# Patient Record
Sex: Female | Born: 1970 | Race: White | Hispanic: No | Marital: Married
Health system: Midwestern US, Community
[De-identification: ages and names within clinical notes are randomized; demographics above are authoritative.]

## PROBLEM LIST (undated history)

## (undated) DIAGNOSIS — Z01818 Encounter for other preprocedural examination: Secondary | ICD-10-CM

## (undated) DIAGNOSIS — N6320 Unspecified lump in the left breast, unspecified quadrant: Secondary | ICD-10-CM

## (undated) DIAGNOSIS — Z1231 Encounter for screening mammogram for malignant neoplasm of breast: Secondary | ICD-10-CM

## (undated) DIAGNOSIS — N800 Endometriosis of uterus: Secondary | ICD-10-CM

## (undated) DIAGNOSIS — R928 Other abnormal and inconclusive findings on diagnostic imaging of breast: Secondary | ICD-10-CM

## (undated) DIAGNOSIS — Z1239 Encounter for other screening for malignant neoplasm of breast: Secondary | ICD-10-CM

## (undated) DIAGNOSIS — G8918 Other acute postprocedural pain: Secondary | ICD-10-CM

## (undated) DIAGNOSIS — Z8619 Personal history of other infectious and parasitic diseases: Secondary | ICD-10-CM

## (undated) DIAGNOSIS — T7840XA Allergy, unspecified, initial encounter: Secondary | ICD-10-CM

## (undated) DIAGNOSIS — L309 Dermatitis, unspecified: Secondary | ICD-10-CM

## (undated) DIAGNOSIS — N951 Menopausal and female climacteric states: Secondary | ICD-10-CM

## (undated) HISTORY — DX: Personal history of other infectious and parasitic diseases: Z86.19

## (undated) HISTORY — DX: Allergy, unspecified, initial encounter: T78.40XA

## (undated) HISTORY — DX: Dermatitis, unspecified: L30.9

---

## 1992-03-05 HISTORY — PX: WISDOM TOOTH EXTRACTION: SHX21

## 2000-03-05 HISTORY — PX: DILATION AND CURETTAGE OF UTERUS: SHX78

## 2005-12-05 ENCOUNTER — Ambulatory Visit: Payer: Self-pay | Admitting: Internal Medicine

## 2006-03-05 HISTORY — PX: CYSTOSCOPY: SUR368

## 2007-04-11 ENCOUNTER — Ambulatory Visit: Payer: Self-pay | Admitting: Urology

## 2007-04-14 ENCOUNTER — Ambulatory Visit: Payer: Self-pay | Admitting: Urology

## 2011-05-24 ENCOUNTER — Ambulatory Visit: Payer: Self-pay | Admitting: Family Medicine

## 2011-05-24 ENCOUNTER — Emergency Department: Payer: Self-pay | Admitting: *Deleted

## 2011-05-24 LAB — COMPREHENSIVE METABOLIC PANEL
Albumin: 4.2 g/dL (ref 3.4–5.0)
Anion Gap: 6 — ABNORMAL LOW (ref 7–16)
Bilirubin,Total: 0.3 mg/dL (ref 0.2–1.0)
Co2: 31 mmol/L (ref 21–32)
Creatinine: 0.85 mg/dL (ref 0.60–1.30)
EGFR (African American): >60
Glucose: 116 mg/dL — ABNORMAL HIGH (ref 65–99)
Potassium: 4.4 mmol/L (ref 3.5–5.1)
SGOT(AST): 12 U/L — ABNORMAL LOW (ref 15–37)
Sodium: 139 mmol/L (ref 136–145)
Total Protein: 7.2 g/dL (ref 6.4–8.2)

## 2011-05-24 LAB — CBC
HCT: 39.7 % (ref 35.0–47.0)
HGB: 13.3 g/dL (ref 12.0–16.0)
MCV: 88 fL (ref 80–100)
Platelet: 247 10*3/uL (ref 150–440)

## 2011-05-24 LAB — LIPASE, BLOOD: Lipase: 125 U/L (ref 73–393)

## 2011-05-28 ENCOUNTER — Ambulatory Visit: Payer: Self-pay | Admitting: Family Medicine

## 2012-02-09 ENCOUNTER — Ambulatory Visit (INDEPENDENT_AMBULATORY_CARE_PROVIDER_SITE_OTHER): Payer: 59 | Admitting: Physician Assistant

## 2012-02-09 VITALS — BP 113/72 | HR 70 | Temp 98.1°F | Resp 16 | Ht 62.0 in | Wt 135.0 lb

## 2012-02-09 DIAGNOSIS — H9190 Unspecified hearing loss, unspecified ear: Secondary | ICD-10-CM

## 2012-02-09 DIAGNOSIS — H6120 Impacted cerumen, unspecified ear: Secondary | ICD-10-CM

## 2012-02-09 NOTE — Progress Notes (Signed)
   Patient ID: Renee Brooks MRN: [id number], DOB: August 16, 1970, 41 y.o. Date of Encounter: 02/09/2012, 7:11 PM  Primary Physician: Clydell Hakim, MD  Chief Complaint: Cerumen impaction  HPI: 41 y.o. year old female with history below presents with bilateral cerumen impaction. Symptoms began several weeks ago. Left side is worse than the right side. History of increased wax production requiring the ears to be flushed out previously. She complains of considerably decreased hearing along the left side. Last time she had them cleaned out was at ENT about 2 years ago. Uses Q-tips and feels like she may have pushed the wax further down her ear canal. No tinnitus, or vertigo symptoms. No drainage, discharge, or bleeding from the ears. No URI symptoms. Otherwise doing well without issues today.    Past Medical History  Diagnosis Date  . Allergy      Home Meds: Prior to Admission medications   Medication Sig Start Date End Date Taking? Authorizing Provider  ibuprofen (ADVIL,MOTRIN) 800 MG tablet Take 800 mg by mouth every 8 (eight) hours as needed.   Yes Historical Provider, MD    Allergies: No Known Allergies  History   Social History  . Marital Status: Married    Spouse Name: N/A    Number of Children: N/A  . Years of Education: N/A   Occupational History  . Not on file.   Social History Main Topics  . Smoking status: Never Smoker   . Smokeless tobacco: Not on file  . Alcohol Use: No  . Drug Use: No  . Sexually Active: Yes    Birth Control/ Protection: Other-see comments   Other Topics Concern  . Not on file   Social History Narrative  . No narrative on file     Review of Systems: Constitutional: negative for chills, fever, night sweats, weight changes, or fatigue  HEENT: negative for vision changes, congestion, rhinorrhea, ST, epistaxis, or sinus pressure Cardiovascular: negative for chest pain or palpitations Respiratory: negative for cough   Physical  Exam: Blood pressure 113/72, pulse 70, temperature 98.1 F (36.7 C), temperature source Oral, resp. rate 16, height 5\' 2"  (1.575 m), weight 135 lb (61.236 kg), last menstrual period 01/26/2012, SpO2 99.00%., Body mass index is 24.69 kg/(m^2). General: Well developed, well nourished, in no acute distress. Head: Normocephalic, atraumatic, eyes without discharge, sclera non-icteric, nares are without discharge. Bilateral auditory canals with cerumen impaction. S/P ear lavage bilateral auditory canals clear. Bilateral TM's are without perforation, pearly grey and translucent with reflective cone of light bilaterally. No mastoid TTP. Oral cavity moist, posterior pharynx without exudate, erythema, peritonsillar abscess, or post nasal drip. Neck: Supple. No thyromegaly. Full ROM. No lymphadenopathy. Lungs: Clear bilaterally to auscultation without wheezes, rales, or rhonchi. Breathing is unlabored. Heart: RRR with S1 S2. No murmurs, rubs, or gallops appreciated. Msk:  Strength and tone normal for age. Extremities/Skin: Warm and dry. No clubbing or cyanosis. No edema. No rashes or suspicious lesions. Neuro: Alert and oriented X 3. Moves all extremities spontaneously. Gait is normal. CNII-XII grossly in tact. Psych:  Responds to questions appropriately with a normal affect.   Ear lavage performed by: Karlene Einstein   ASSESSMENT AND PLAN:  41 y.o. year old female with cerumen impaction s/p ear lavage. -Resolved -Ear lavage per above -RTC prn  Signed, Eula Listen, PA-C 02/09/2012 7:11 PM

## 2013-01-03 HISTORY — PX: OTHER SURGICAL HISTORY: SHX169

## 2013-06-12 DIAGNOSIS — N6009 Solitary cyst of unspecified breast: Secondary | ICD-10-CM | POA: Insufficient documentation

## 2013-07-03 HISTORY — PX: OTHER SURGICAL HISTORY: SHX169

## 2013-07-23 IMAGING — CR DG CHEST 1V PORT
1 series · 1 of 1 positions shown · non-contrast
Comparison: none

REASON FOR EXAM: chest pain
COMMENTS:

[portable]
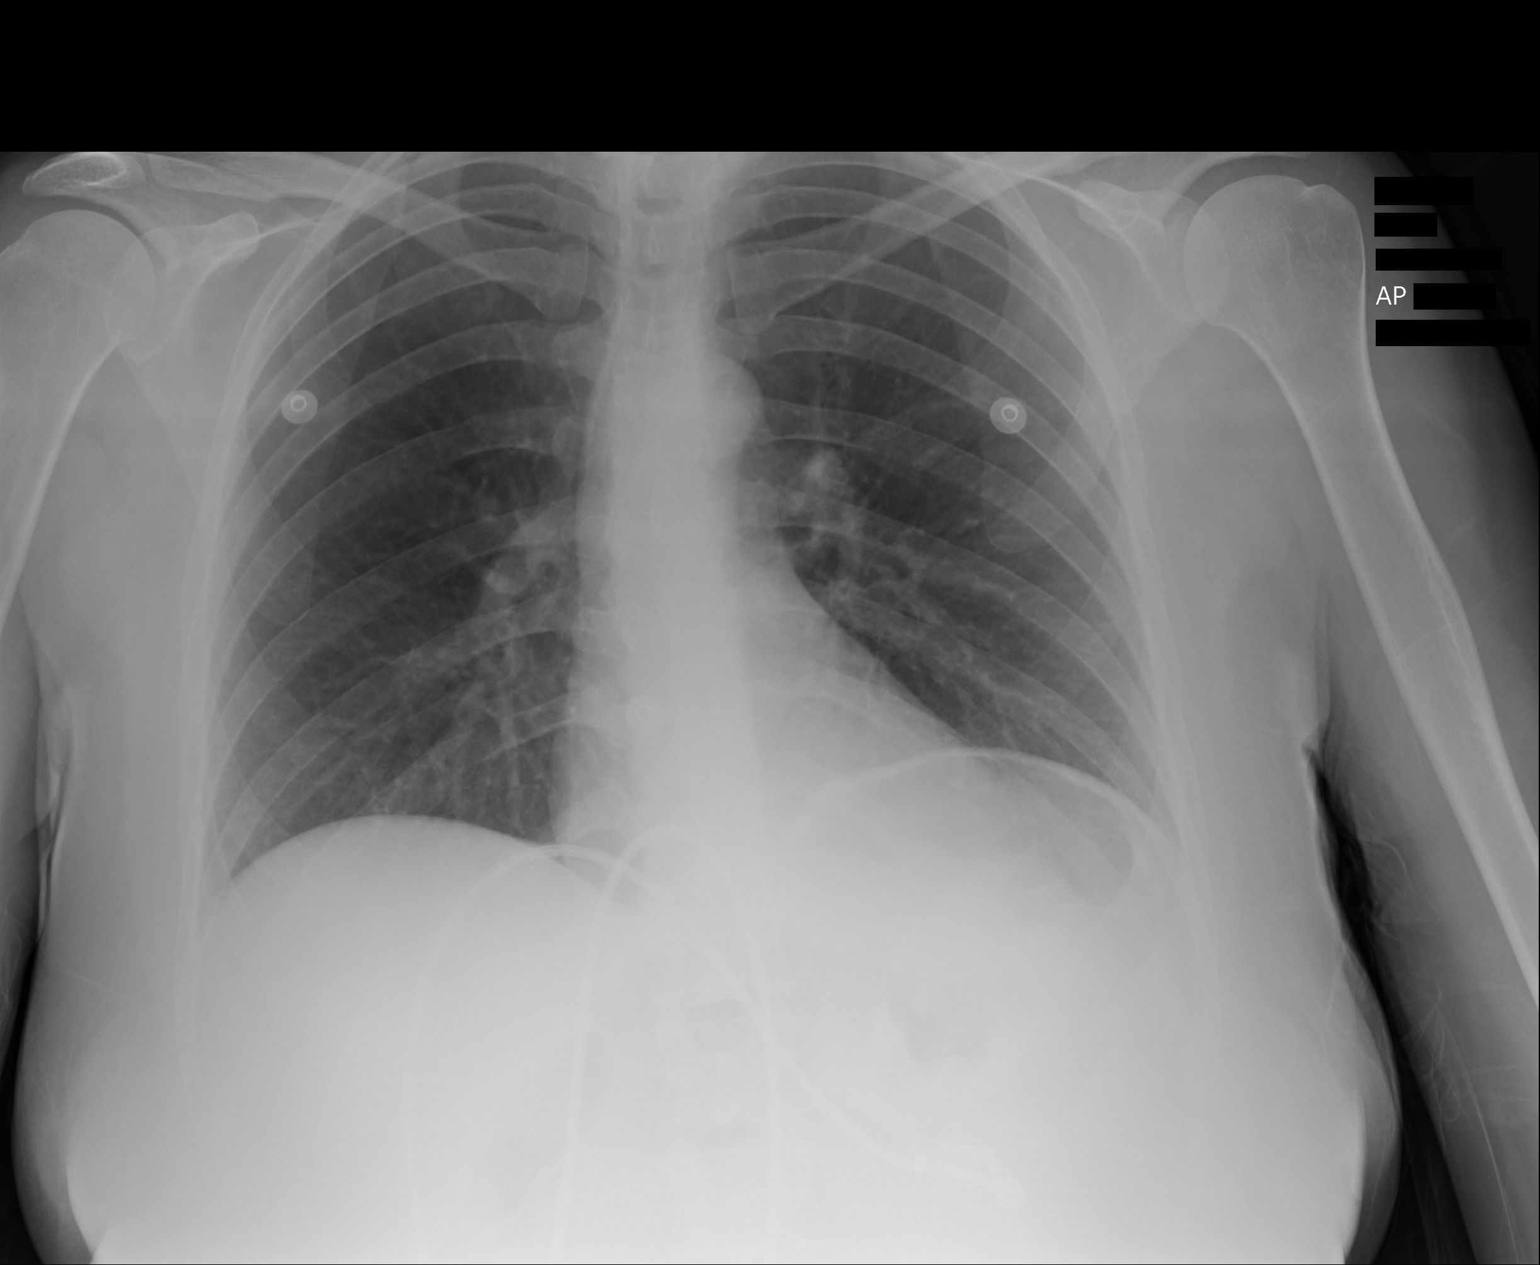

[1 of 1 positions shown; findings below may reference images not displayed]

PROCEDURE:     DXR - DXR PORTABLE CHEST SINGLE VIEW  - May 24, 2011  [DATE]

RESULT:     There is no previous exam for comparison.

The lungs are clear. The heart and pulmonary vessels are normal. The bony
and mediastinal structures are unremarkable. There is no effusion. There is
no pneumothorax or evidence of congestive failure.
IMPRESSION: No acute cardiopulmonary disease.

## 2014-11-17 DIAGNOSIS — J309 Allergic rhinitis, unspecified: Secondary | ICD-10-CM | POA: Insufficient documentation

## 2014-11-17 DIAGNOSIS — L309 Dermatitis, unspecified: Secondary | ICD-10-CM

## 2014-11-18 ENCOUNTER — Ambulatory Visit (INDEPENDENT_AMBULATORY_CARE_PROVIDER_SITE_OTHER): Payer: 59 | Admitting: Family Medicine

## 2014-11-18 ENCOUNTER — Ambulatory Visit
Admission: RE | Admit: 2014-11-18 | Discharge: 2014-11-18 | Disposition: A | Discharge status: Home / Self Care | Payer: 59 | Source: Ambulatory Visit | Attending: Family Medicine | Admitting: Family Medicine

## 2014-11-18 ENCOUNTER — Encounter: Payer: Self-pay | Admitting: Family Medicine

## 2014-11-18 VITALS — BP 112/60 | HR 60 | Temp 99.0°F | Resp 16 | Wt 124.4 lb

## 2014-11-18 DIAGNOSIS — M545 Low back pain, unspecified: Secondary | ICD-10-CM | POA: Insufficient documentation

## 2014-11-18 DIAGNOSIS — Z8619 Personal history of other infectious and parasitic diseases: Secondary | ICD-10-CM | POA: Insufficient documentation

## 2014-11-18 DIAGNOSIS — M542 Cervicalgia: Secondary | ICD-10-CM | POA: Insufficient documentation

## 2014-11-18 DIAGNOSIS — Z13220 Encounter for screening for lipoid disorders: Secondary | ICD-10-CM | POA: Insufficient documentation

## 2014-11-18 DIAGNOSIS — M549 Dorsalgia, unspecified: Secondary | ICD-10-CM | POA: Diagnosis present

## 2014-11-18 DIAGNOSIS — R42 Dizziness and giddiness: Secondary | ICD-10-CM

## 2014-11-18 DIAGNOSIS — Z131 Encounter for screening for diabetes mellitus: Secondary | ICD-10-CM | POA: Insufficient documentation

## 2014-11-18 DIAGNOSIS — J329 Chronic sinusitis, unspecified: Secondary | ICD-10-CM | POA: Insufficient documentation

## 2014-11-18 NOTE — Progress Notes (Signed)
Patient: Renee Brooks Female    DOB: [birth year]   44 y.o.   MRN: [id number] Visit Date: 11/18/2014  Today's Provider: Mila Merry, MD   Chief Complaint  Patient presents with  . Back Pain   Subjective:    Back Pain This is a recurrent (Low back pain) problem. The current episode started more than 1 month ago. The problem is unchanged ( ). The pain is at a severity of 5/10 (mild to moderate). The pain is mild. The pain is worse during the day. The symptoms are aggravated by sitting, standing, bending and position. Pertinent negatives include no abdominal pain, chest pain, fever or weakness.  Patient sees the chiropractor as needed. Has been seeing the chiropractor since April when her right hip started hurting. and has been there more often in the past week. States she often feels a popping in her lumbar back and sometimes in her neck when she is exercising. She states if she takes ibuprofen before she goes to bed that her back feels good in the morning, but usually starts hurting again as the day goes by. She thinks the chiropractic treatments help a bit. She was also prescribed tizanidine last year for these symptoms. She states it helps a little, but she doesn't like to take it because it makes her groggy. She denies pain radiating into UEs or LEs.   She also reports having dizzy spells a few times every year. The last episode was 5 days ago when she got out of bed and felt light headed and off balance. No spinning sensation, but felt nauseated when looking up. Mostly resolved after about a day, but still feels a little nauseated today and aching more in her neck.   She denies being prone to any other orthopedic injuries such as ankle, knee, wrist or shoulder sprains.     No Known Allergies Previous Medications   FEXOFENADINE (ALLEGRA) 180 MG TABLET    Take 180 mg by mouth daily as needed for allergies or rhinitis.   FLUTICASONE (FLONASE) 50 MCG/ACT NASAL SPRAY    Place 1-2  sprays into both nostrils daily as needed for allergies or rhinitis.   IBUPROFEN (ADVIL,MOTRIN) 800 MG TABLET    Take 800 mg by mouth every 8 (eight) hours as needed.   NAPROXEN (NAPROSYN) 250 MG TABLET    Take 250 mg by mouth 2 (two) times daily with a meal.   TIZANIDINE (ZANAFLEX) 4 MG TABLET    Take 4 mg by mouth every 8 (eight) hours as needed for muscle spasms.    Review of Systems  Constitutional: Negative for fever, chills, appetite change and fatigue.  Respiratory: Negative for chest tightness and shortness of breath.   Cardiovascular: Negative for chest pain and palpitations.  Gastrointestinal: Negative for nausea, vomiting and abdominal pain.  Musculoskeletal: Positive for back pain, arthralgias and neck pain.  Skin: Negative.   Neurological: Positive for dizziness (thinks to the neck and back pain). Negative for weakness.  Psychiatric/Behavioral: Negative.     Social History  Substance Use Topics  . Smoking status: Former Smoker -- 12 years    Quit date: 03/05/1992  . Smokeless tobacco: Not on file  . Alcohol Use: No   Objective:   BP 112/60 mmHg  Pulse 60  Temp(Src) 99 F (37.2 C) (Oral)  Resp 16  Wt 124 lb 6.4 oz (56.427 kg)  LMP 11/11/2014  Physical Exam   General Appearance:    Alert, cooperative, no  distress  Eyes:    PERRL, conjunctiva/corneas clear, EOM's intact       Lungs:     Clear to auscultation bilaterally, respirations unlabored  Heart:    Regular rate and rhythm, No bruits  Neurologic:   Awake, alert, oriented x 3. No apparent focal neurological           defect.   MS:   Mild tenderness paracervical and para lumbar muscles. No spine tenderness.   HEENT:  Mild tender anterior cervical LAD, L>R. OP/NP clear        Assessment & Plan:     1. Low back pain, unspecified back pain laterality, with sciatica presence unspecified  - DG Lumbar Spine Complete; Future  2. Neck pain  - DG Cervical Spine Complete; Future  Consider PT referral if  Xrays normal.   3. Dizziness Most likely unrelated to musculoskeletal complaints. She does have slightly elevated temperature today and mild LAD suggestive of viral syndrome which is likely resolving. Check labs.  - TSH - Comprehensive metabolic panel - CBC       Mila Merry, MD  [facility]

## 2014-11-19 ENCOUNTER — Telehealth: Payer: Self-pay | Admitting: *Deleted

## 2014-11-19 LAB — CBC
Hematocrit: 40.5 % (ref 34.0–46.6)
Hemoglobin: 13.7 g/dL (ref 11.1–15.9)
MCH: 29.2 pg (ref 26.6–33.0)
MCHC: 33.8 g/dL (ref 31.5–35.7)
MCV: 86 fL (ref 79–97)
PLATELETS: 280 10*3/uL (ref 150–379)
RBC: 4.69 x10E6/uL (ref 3.77–5.28)
RDW: 13.4 % (ref 12.3–15.4)
WBC: 5.8 10*3/uL (ref 3.4–10.8)

## 2014-11-19 LAB — COMPREHENSIVE METABOLIC PANEL
ALK PHOS: 57 IU/L (ref 39–117)
ALT: 10 IU/L (ref 0–32)
AST: 16 IU/L (ref 0–40)
Albumin/Globulin Ratio: 2 (ref 1.1–2.5)
Albumin: 4.6 g/dL (ref 3.5–5.5)
BILIRUBIN TOTAL: 0.5 mg/dL (ref 0.0–1.2)
BUN/Creatinine Ratio: 21 (ref 9–23)
BUN: 17 mg/dL (ref 6–24)
CHLORIDE: 99 mmol/L (ref 97–108)
CO2: 24 mmol/L (ref 18–29)
CREATININE: 0.8 mg/dL (ref 0.57–1.00)
Calcium: 9.5 mg/dL (ref 8.7–10.2)
GFR calc Af Amer: 103 mL/min/{1.73_m2} (ref >59)
GFR calc non Af Amer: 89 mL/min/{1.73_m2} (ref >59)
GLOBULIN, TOTAL: 2.3 g/dL (ref 1.5–4.5)
GLUCOSE: 92 mg/dL (ref 65–99)
Potassium: 5.5 mmol/L — ABNORMAL HIGH (ref 3.5–5.2)
SODIUM: 141 mmol/L (ref 134–144)
Total Protein: 6.9 g/dL (ref 6.0–8.5)

## 2014-11-19 LAB — TSH: TSH: 1.22 u[IU]/mL (ref 0.450–4.500)

## 2014-11-19 NOTE — Telephone Encounter (Signed)
Patient notified

## 2014-11-19 NOTE — Telephone Encounter (Signed)
Message therapy usually helps, but is usually temporary similar to chiropractic treatments. Just call if she decides to try PT.

## 2014-11-19 NOTE — Telephone Encounter (Signed)
Called [facility] who stated all labs but TSH are done.

## 2014-11-19 NOTE — Telephone Encounter (Signed)
Patient called office requesting her lab results and x-ray results. Please advise?

## 2014-11-19 NOTE — Telephone Encounter (Signed)
Patient notified of results. Patient stated that she wants to try massage therapy first, to see it that helps. Patient wanted to know what Dr. Sherrie Mustache thinks about massage therapy? Patient said that the massage therapy doesn't work she will try physical therapy. Please advise?

## 2014-11-19 NOTE — Telephone Encounter (Signed)
Xrays are normal. Labs results are not in, but they should be. Please call [facility] and find out of labs have been completed. Thanks.

## 2015-01-01 ENCOUNTER — Ambulatory Visit (INDEPENDENT_AMBULATORY_CARE_PROVIDER_SITE_OTHER): Payer: 59

## 2015-01-01 DIAGNOSIS — Z23 Encounter for immunization: Secondary | ICD-10-CM | POA: Diagnosis not present

## 2015-01-13 ENCOUNTER — Ambulatory Visit
Admission: RE | Admit: 2015-01-13 | Discharge: 2015-01-13 | Disposition: A | Discharge status: Home / Self Care | Payer: 59 | Source: Ambulatory Visit | Attending: Physician Assistant | Admitting: Physician Assistant

## 2015-01-13 ENCOUNTER — Encounter: Payer: Self-pay | Admitting: Physician Assistant

## 2015-01-13 ENCOUNTER — Ambulatory Visit (INDEPENDENT_AMBULATORY_CARE_PROVIDER_SITE_OTHER): Payer: 59 | Admitting: Physician Assistant

## 2015-01-13 VITALS — BP 118/60 | HR 83 | Temp 98.6°F | Resp 16 | Wt 126.6 lb

## 2015-01-13 DIAGNOSIS — R1084 Generalized abdominal pain: Secondary | ICD-10-CM | POA: Diagnosis not present

## 2015-01-13 DIAGNOSIS — R140 Abdominal distension (gaseous): Secondary | ICD-10-CM

## 2015-01-13 DIAGNOSIS — K5900 Constipation, unspecified: Secondary | ICD-10-CM | POA: Diagnosis not present

## 2015-01-13 LAB — POC HEMOCCULT BLD/STL (OFFICE/1-CARD/DIAGNOSTIC): FECAL OCCULT BLD: NEGATIVE

## 2015-01-13 NOTE — Progress Notes (Signed)
Patient: Renee Brooks Female    DOB: [birth year]   44 y.o.   MRN: [id number] Visit Date: 01/13/2015  Today's Provider: Margaretann Loveless, PA-C   No chief complaint on file.  Subjective:    HPI Hemorrhoids: Patient complains of possible hemorrhoids. Onset of symptoms was gradual starting 6 months ago it depends on the day since that time.  She describes symptoms as anorectal itching, uncomfortable when sitting doesn't know if it is coming from her lower back pain or the possible hemorrhoids.. Treatment to date has been none. Patient feels bloated during her cycle. Patient states does push hard sometimes when having a bowel movement. No bleeding, no rectal pain, sometimes does have abdominal pain not sure if it is due to a lot of gas. She does also complain of constipation. She is curious if the constipation is what may be causing some of her back pain and abdominal cramping as well as the bloating. She states that she used to be fairly regular but over the last 6 months she has become less and less regular with her bowel movements. She feels that she may be going through menopause and is curious if this may be causing the decreased bowel movements. She does state that she does not drink enough water and does not get enough fiber in her diet. She has been tried increased eating broccoli to increase fiber. She also is concerned that the constipation may be caused by a tumor even though there is no family history of colon cancer. She denies any hematochezia or melena. She also questions if the constipation could be secondary to IBS. She has thought that she had IBS in the past but the symptoms improved. She does state that she has been stressed recently. Her husband hasn't working in [place] and so she has had full duties of running a household, getting her kids ready into school as well as working full-time. This has been stressful for her and does correlate with the six-month time  frame from when her symptoms changed.     No Known Allergies Previous Medications   FEXOFENADINE (ALLEGRA) 180 MG TABLET    Take 180 mg by mouth daily as needed for allergies or rhinitis.   FLUTICASONE (FLONASE) 50 MCG/ACT NASAL SPRAY    Place 1-2 sprays into both nostrils daily as needed for allergies or rhinitis.   IBUPROFEN (ADVIL,MOTRIN) 800 MG TABLET    Take 800 mg by mouth every 8 (eight) hours as needed.   NAPROXEN (NAPROSYN) 250 MG TABLET    Take 250 mg by mouth 2 (two) times daily with a meal.   SACCHAROMYCES BOULARDII (PROBIOTIC) 250 MG CAPS    Take by mouth daily.   TIZANIDINE (ZANAFLEX) 4 MG TABLET    Take 4 mg by mouth every 8 (eight) hours as needed for muscle spasms.    Review of Systems  Constitutional: Negative.   Respiratory: Negative.   Cardiovascular: Negative.   Gastrointestinal: Positive for abdominal pain and diarrhea (Last week). Negative for constipation, blood in stool, anal bleeding and rectal pain.  Endocrine: Negative.   Genitourinary: Negative.   Neurological: Negative.   Psychiatric/Behavioral: Positive for dysphoric mood. The patient is nervous/anxious.     Social History  Substance Use Topics  . Smoking status: Former Smoker -- 12 years    Quit date: 03/05/1992  . Smokeless tobacco: Not on file  . Alcohol Use: No   Objective:   BP 118/60 mmHg  Pulse  83  Temp(Src) 98.6 F (37 C) (Oral)  Resp 16  Wt 126 lb 9.6 oz (57.425 kg)  Physical Exam  Constitutional: She appears well-developed and well-nourished. No distress.  Cardiovascular: Normal rate, regular rhythm and normal heart sounds.  Exam reveals no gallop and no friction rub.   No murmur heard. Pulmonary/Chest: Effort normal and breath sounds normal. No respiratory distress. She has no wheezes. She has no rales.  Abdominal: Soft. Bowel sounds are normal. She exhibits no distension and no mass. There is no hepatosplenomegaly. There is generalized tenderness. There is no rebound, no guarding  and no CVA tenderness. No hernia. Hernia confirmed negative in the ventral area.  Genitourinary: Rectal exam shows external hemorrhoid. Rectal exam shows no internal hemorrhoid, no fissure, no mass, no tenderness and anal tone normal. Guaiac negative stool.  Skin: She is not diaphoretic.  Vitals reviewed.       Assessment & Plan:     1. Generalized abdominal pain  CLINICAL DATA: Intermittent left upper quadrant pain for the past 6 months associated with bloating ; history of intermittent constipation and diarrhea  EXAM: ABDOMEN - 2 VIEW  COMPARISON: Lumbar spine series of November 18, 2014  FINDINGS: The colonic stool burden is increased. There is no small or large bowel obstructive pattern. There are no abnormal soft tissue calcifications. There are stable phleboliths within the pelvis. The bony structures exhibit no acute abnormalities. The lung bases are clear.  IMPRESSION: Increased colonic stool burden consistent with constipation in the appropriate clinical setting. No acute intra-abdominal abnormality is observed.   Electronically Signed  By: David SwazilandJordan M.D.  On: 01/14/2015 07:45 As stated above she has had an increased stool burden compared to previous x-ray done on 11/18/2014 for low back pain. I will treat her for constipation with MiraLAX and fiber supplements. She voiced understanding. She is going to use the MiraLAX for approximately 1 week then switch over to fiber. She will call the office in 2-4 weeks to let me know how she is doing with this. If she does not have any improvement with this treatment then I will refer her to gastroenterology for further evaluation of constipation. If she does improve we will then discuss at that time consideration of treatment for IBS to prevent future episodes of constipation. She will call the office and we will discuss then follow-up will be determined with how she is doing. - DG Abd 2 Views; Future - POC  Hemoccult Bld/Stl (1-Cd Office Dx)  2. Bloating See above medical treatment plan. - DG Abd 2 Views; Future  3. Constipation, unspecified constipation type See above medical treatment plan.       Margaretann LovelessJennifer M Burnette, PA-C  [facility]

## 2015-01-13 NOTE — Patient Instructions (Signed)
Constipation, Adult Constipation is when a person has fewer than three bowel movements a week, has difficulty having a bowel movement, or has stools that are dry, hard, or larger than normal. As people grow older, constipation is more common. A low-fiber diet, not taking in enough fluids, and taking certain medicines may make constipation worse.  CAUSES   Certain medicines, such as antidepressants, pain medicine, iron supplements, antacids, and water pills.   Certain diseases, such as diabetes, irritable bowel syndrome (IBS), thyroid disease, or depression.   Not drinking enough water.   Not eating enough fiber-rich foods.   Stress or travel.   Lack of physical activity or exercise.   Ignoring the urge to have a bowel movement.   Using laxatives too much.  SIGNS AND SYMPTOMS   Having fewer than three bowel movements a week.   Straining to have a bowel movement.   Having stools that are hard, dry, or larger than normal.   Feeling full or bloated.   Pain in the lower abdomen.   Not feeling relief after having a bowel movement.  DIAGNOSIS  Your health care provider will take a medical history and perform a physical exam. Further testing may be done for severe constipation. Some tests may include:  A barium enema X-ray to examine your rectum, colon, and, sometimes, your small intestine.   A sigmoidoscopy to examine your lower colon.   A colonoscopy to examine your entire colon. TREATMENT  Treatment will depend on the severity of your constipation and what is causing it. Some dietary treatments include drinking more fluids and eating more fiber-rich foods. Lifestyle treatments may include regular exercise. If these diet and lifestyle recommendations do not help, your health care provider may recommend taking over-the-counter laxative medicines to help you have bowel movements. Prescription medicines may be prescribed if over-the-counter medicines do not work.   HOME CARE INSTRUCTIONS   Eat foods that have a lot of fiber, such as fruits, vegetables, whole grains, and beans.  Limit foods high in fat and processed sugars, such as french fries, hamburgers, cookies, candies, and soda.   A fiber supplement may be added to your diet if you cannot get enough fiber from foods.   Drink enough fluids to keep your urine clear or pale yellow.   Exercise regularly or as directed by your health care provider.   Go to the restroom when you have the urge to go. Do not hold it.   Only take over-the-counter or prescription medicines as directed by your health care provider. Do not take other medicines for constipation without talking to your health care provider first.  SEEK IMMEDIATE MEDICAL CARE IF:   You have bright red blood in your stool.   Your constipation lasts for more than 4 days or gets worse.   You have abdominal or rectal pain.   You have thin, pencil-like stools.   You have unexplained weight loss. MAKE SURE YOU:   Understand these instructions.  Will watch your condition.  Will get help right away if you are not doing well or get worse.   This information is not intended to replace advice given to you by your health care provider. Make sure you discuss any questions you have with your health care provider.   Document Released: 11/18/2003 Document Revised: 03/12/2014 Document Reviewed: 12/01/2012 Elsevier Interactive Patient Education 2016 Elsevier Inc.  Irritable Bowel Syndrome, Adult Irritable bowel syndrome (IBS) is not one specific disease. It is a group of symptoms that  affects the organs responsible for digestion (gastrointestinal or GI tract).  To regulate how your GI tract works, your body sends signals back and forth between your intestines and your brain. If you have IBS, there may be a problem with these signals. As a result, your GI tract does not function normally. Your intestines may become more sensitive and  overreact to certain things. This is especially true when you eat certain foods or when you are under stress.  There are four types of IBS. These may be determined based on the consistency of your stool:   IBS with diarrhea.   IBS with constipation.   Mixed IBS.   Unsubtyped IBS.  It is important to know which type of IBS you have. Some treatments are more likely to be helpful for certain types of IBS.  CAUSES  The exact cause of IBS is not known. RISK FACTORS You may have a higher risk of IBS if:  You are a woman.  You are younger than 46 years old.  You have a family history of IBS.  You have mental health problems.  You have had bacterial infection of your GI tract. SIGNS AND SYMPTOMS  Symptoms of IBS vary from person to person. The main symptom is abdominal pain or discomfort. Additional symptoms usually include one or more of the following:   Diarrhea, constipation, or both.   Abdominal swelling or bloating.   Feeling full or sick after eating a small or regular-size meal.   Frequent gas.   Mucus in the stool.   A feeling of having more stool left after a bowel movement.  Symptoms tend to come and go. They may be associated with stress, psychiatric conditions, or nothing at all.  DIAGNOSIS  There is no specific test to diagnose IBS. Your health care provider will make a diagnosis based on a physical exam, medical history, and your symptoms. You may have other tests to rule out other conditions that may be causing your symptoms. These may include:   Blood tests.   X-rays.   CT scan.  Endoscopy and colonoscopy. This is a test in which your GI tract is viewed with a long, thin, flexible tube. TREATMENT There is no cure for IBS, but treatment can help relieve symptoms. IBS treatment often includes:   Changes to your diet, such as:  Eating more fiber.  Avoiding foods that cause symptoms.  Drinking more water.  Eating regular, medium-sized  portioned meals.  Medicines. These may include:  Fiber supplements if you have constipation.  Medicine to control diarrhea (antidiarrheal medicines).  Medicine to help control muscle spasms in your GI tract (antispasmodic medicines).  Medicines to help with any mental health issues, such as antidepressants or tranquilizers.  Therapy.  Talk therapy may help with anxiety, depression, or other mental health issues that can make IBS symptoms worse.  Stress reduction.  Managing your stress can help keep symptoms under control. HOME CARE INSTRUCTIONS   Take medicines only as directed by your health care provider.  Eat a healthy diet.  Avoid foods and drinks with added sugar.  Include more whole grains, fruits, and vegetables gradually into your diet. This may be especially helpful if you have IBS with constipation.  Avoid any foods and drinks that make your symptoms worse. These may include dairy products and caffeinated or carbonated drinks.  Do not eat large meals.  Drink enough fluid to keep your urine clear or pale yellow.  Exercise regularly. Ask your health care  provider for recommendations of good activities for you.  Keep all follow-up visits as directed by your health care provider. This is important. SEEK MEDICAL CARE IF:   You have constant pain.  You have trouble or pain with swallowing.  You have worsening diarrhea. SEEK IMMEDIATE MEDICAL CARE IF:   You have severe and worsening abdominal pain.   You have diarrhea and:   You have a rash, stiff neck, or severe headache.   You are irritable, sleepy, or difficult to awaken.   You are weak, dizzy, or extremely thirsty.   You have bright red blood in your stool or you have black tarry stools.   You have unusual abdominal swelling that is painful.   You vomit continuously.   You vomit blood (hematemesis).   You have both abdominal pain and a fever.    This information is not intended to  replace advice given to you by your health care provider. Make sure you discuss any questions you have with your health care provider.   Document Released: 02/19/2005 Document Revised: 03/12/2014 Document Reviewed: 11/06/2013 Elsevier Interactive Patient Education [employer]2016 Elsevier Inc.

## 2015-06-17 ENCOUNTER — Ambulatory Visit (INDEPENDENT_AMBULATORY_CARE_PROVIDER_SITE_OTHER): Payer: 59 | Admitting: Physician Assistant

## 2015-06-17 ENCOUNTER — Encounter: Payer: Self-pay | Admitting: Physician Assistant

## 2015-06-17 VITALS — BP 118/56 | HR 63 | Temp 98.8°F | Resp 14 | Wt 127.0 lb

## 2015-06-17 DIAGNOSIS — J069 Acute upper respiratory infection, unspecified: Secondary | ICD-10-CM | POA: Diagnosis not present

## 2015-06-17 DIAGNOSIS — R05 Cough: Secondary | ICD-10-CM | POA: Diagnosis not present

## 2015-06-17 DIAGNOSIS — R059 Cough, unspecified: Secondary | ICD-10-CM

## 2015-06-17 MED ORDER — AZITHROMYCIN 250 MG PO TABS: ORAL_TABLET | ORAL | Status: AC

## 2015-06-17 MED ORDER — TIZANIDINE HCL 4 MG PO TABS: 4.0000 mg | ORAL_TABLET | Freq: Three times a day (TID) | ORAL | Status: AC | PRN

## 2015-06-17 NOTE — Patient Instructions (Signed)
Upper Respiratory Infection, Adult Most upper respiratory infections (URIs) are a viral infection of the air passages leading to the lungs. A URI affects the nose, throat, and upper air passages. The most common type of URI is nasopharyngitis and is typically referred to as "the common cold." URIs run their course and usually go away on their own. Most of the time, a URI does not require medical attention, but sometimes a bacterial infection in the upper airways can follow a viral infection. This is called a secondary infection. Sinus and middle ear infections are common types of secondary upper respiratory infections. Bacterial pneumonia can also complicate a URI. A URI can worsen asthma and chronic obstructive pulmonary disease (COPD). Sometimes, these complications can require emergency medical care and may be life threatening.  CAUSES Almost all URIs are caused by viruses. A virus is a type of germ and can spread from one person to another.  RISKS FACTORS You may be at risk for a URI if:   You smoke.   You have chronic heart or lung disease.  You have a weakened defense (immune) system.   You are very young or very old.   You have nasal allergies or asthma.  You work in crowded or poorly ventilated areas.  You work in health care facilities or schools. SIGNS AND SYMPTOMS  Symptoms typically develop 2-3 days after you come in contact with a cold virus. Most viral URIs last 7-10 days. However, viral URIs from the influenza virus (flu virus) can last 14-18 days and are typically more severe. Symptoms may include:   Runny or stuffy (congested) nose.   Sneezing.   Cough.   Sore throat.   Headache.   Fatigue.   Fever.   Loss of appetite.   Pain in your forehead, behind your eyes, and over your cheekbones (sinus pain).  Muscle aches.  DIAGNOSIS  Your health care provider may diagnose a URI by:  Physical exam.  Tests to check that your symptoms are not due to  another condition such as:  Strep throat.  Sinusitis.  Pneumonia.  Asthma. TREATMENT  A URI goes away on its own with time. It cannot be cured with medicines, but medicines may be prescribed or recommended to relieve symptoms. Medicines may help:  Reduce your fever.  Reduce your cough.  Relieve nasal congestion. HOME CARE INSTRUCTIONS   Take medicines only as directed by your health care provider.   Gargle warm saltwater or take cough drops to comfort your throat as directed by your health care provider.  Use a warm mist humidifier or inhale steam from a shower to increase air moisture. This may make it easier to breathe.  Drink enough fluid to keep your urine clear or pale yellow.   Eat soups and other clear broths and maintain good nutrition.   Rest as needed.   Return to work when your temperature has returned to normal or as your health care provider advises. You may need to stay home longer to avoid infecting others. You can also use a face mask and careful hand washing to prevent spread of the virus.  Increase the usage of your inhaler if you have asthma.   Do not use any tobacco products, including cigarettes, chewing tobacco, or electronic cigarettes. If you need help quitting, ask your health care provider. PREVENTION  The best way to protect yourself from getting a cold is to practice good hygiene.   Avoid oral or hand contact with people with cold   symptoms.   Wash your hands often if contact occurs.  There is no clear evidence that vitamin C, vitamin E, echinacea, or exercise reduces the chance of developing a cold. However, it is always recommended to get plenty of rest, exercise, and practice good nutrition.  SEEK MEDICAL CARE IF:   You are getting worse rather than better.   Your symptoms are not controlled by medicine.   You have chills.  You have worsening shortness of breath.  You have brown or red mucus.  You have yellow or brown nasal  discharge.  You have pain in your face, especially when you bend forward.  You have a fever.  You have swollen neck glands.  You have pain while swallowing.  You have white areas in the back of your throat. SEEK IMMEDIATE MEDICAL CARE IF:   You have severe or persistent:  Headache.  Ear pain.  Sinus pain.  Chest pain.  You have chronic lung disease and any of the following:  Wheezing.  Prolonged cough.  Coughing up blood.  A change in your usual mucus.  You have a stiff neck.  You have changes in your:  Vision.  Hearing.  Thinking.  Mood. MAKE SURE YOU:   Understand these instructions.  Will watch your condition.  Will get help right away if you are not doing well or get worse.   This information is not intended to replace advice given to you by your health care provider. Make sure you discuss any questions you have with your health care provider.   Document Released: 08/15/2000 Document Revised: 07/06/2014 Document Reviewed: 05/27/2013 Elsevier Interactive Patient Education 2016 [employer]  

## 2015-06-17 NOTE — Progress Notes (Signed)
Patient ID: Renee Brooks, female   DOB: October 03, 1970, 45 y.o.   MRN: [id number]   Patient: Courtnie [id number] Female    DOB: [birth year]   45 y.o.   MRN: [id number] Visit Date: 06/17/2015  Today's Provider: Margaretann Loveless, PA-C   Chief Complaint  Patient presents with  . URI   Subjective:    URI  This is a new problem. The current episode started 1 to 4 weeks ago. The problem has been unchanged. Associated symptoms include congestion, coughing, a sore throat and wheezing. She has tried decongestant and antihistamine for the symptoms. The treatment provided no relief.      Previous Medications   FEXOFENADINE (ALLEGRA) 180 MG TABLET    Take 180 mg by mouth daily as needed for allergies or rhinitis.   FLUTICASONE (FLONASE) 50 MCG/ACT NASAL SPRAY    Place 1-2 sprays into both nostrils daily as needed for allergies or rhinitis.   IBUPROFEN (ADVIL,MOTRIN) 800 MG TABLET    Take 800 mg by mouth every 8 (eight) hours as needed.   MULTIPLE VITAMIN (MULTIVITAMIN) TABLET    Take 1 tablet by mouth daily.   NAPROXEN (NAPROSYN) 250 MG TABLET    Take 250 mg by mouth 2 (two) times daily with a meal.   SACCHAROMYCES BOULARDII (PROBIOTIC) 250 MG CAPS    Take by mouth daily.   TIZANIDINE (ZANAFLEX) 4 MG TABLET    Take 4 mg by mouth every 8 (eight) hours as needed for muscle spasms.   UNABLE TO FIND    Med Name: cold snap   No Known Allergies  Review of Systems  Constitutional: Negative.  Negative for fever.  HENT: Positive for congestion and sore throat.   Eyes: Negative.   Respiratory: Positive for cough and wheezing.   Cardiovascular: Negative.   Gastrointestinal: Negative.   Endocrine: Negative.   Genitourinary: Negative.   Musculoskeletal: Negative.   Skin: Negative.   Allergic/Immunologic: Negative.   Neurological: Negative.   Hematological: Negative.   Psychiatric/Behavioral: Negative.     Social History  Substance Use Topics  . Smoking status: Former Smoker -- 12 years   Quit date: 03/05/1992  . Smokeless tobacco: Not on file  . Alcohol Use: No   Objective:   BP 118/56 mmHg  Pulse 63  Temp(Src) 98.8 F (37.1 C) (Oral)  Resp 14  Wt 127 lb (57.607 kg)  Physical Exam  Constitutional: She appears well-developed and well-nourished. No distress.  HENT:  Head: Normocephalic and atraumatic.  Right Ear: Hearing, tympanic membrane, external ear and ear canal normal.  Left Ear: Hearing, tympanic membrane, external ear and ear canal normal.  Nose: Nose normal.  Mouth/Throat: Uvula is midline, oropharynx is clear and moist and mucous membranes are normal. No oropharyngeal exudate.  Eyes: Conjunctivae are normal. Pupils are equal, round, and reactive to light. Right eye exhibits no discharge. Left eye exhibits no discharge. No scleral icterus.  Neck: Normal range of motion. Neck supple. No tracheal deviation present. No thyromegaly present.  Cardiovascular: Normal rate, regular rhythm and normal heart sounds.  Exam reveals no gallop and no friction rub.   No murmur heard. Pulmonary/Chest: Effort normal and breath sounds normal. No stridor. No respiratory distress. She has no wheezes. She has no rales.  Lymphadenopathy:    She has no cervical adenopathy.  Skin: Skin is warm and dry. She is not diaphoretic.  Vitals reviewed.       Assessment & Plan:     1. Upper respiratory infection Worsening symptoms  that have not responded to OTC medications. Will give zpak as below. She is to stay well hydrated and get plenty of rest. She is to call if symptoms fail to improve or worsen. - azithromycin (ZITHROMAX) 250 MG tablet; Take 2 tablets PO on day one, and one tablet PO daily thereafter until completed.  Dispense: 6 tablet; Refill: 0  2. Cough See above medical treatment plan.   Follow up: No Follow-up on file.

## 2015-08-05 ENCOUNTER — Ambulatory Visit
Admit: 2015-08-05 | Discharge: 2015-08-05 | Payer: PRIVATE HEALTH INSURANCE | Attending: Obstetrics & Gynecology | Primary: Internal Medicine

## 2015-08-05 DIAGNOSIS — K644 Residual hemorrhoidal skin tags: Secondary | ICD-10-CM

## 2015-08-05 MED ORDER — HYDROCORTISONE 2.5 % RECTAL CREAM
2.5 % | Freq: Four times a day (QID) | CUTANEOUS | 0 refills | Status: DC
Start: 2015-08-05 — End: 2015-10-20

## 2015-08-05 NOTE — Progress Notes (Signed)
Victoria Shaw is a 44yo healthy G3 P2012 (SVDx2) who presents with a painful lump between her vagina and rectum.  First noticed 2-3 days ago, got painful yesterday.  No itching or drainage.  No vag discuarge, UTI sx or changes in BMs.  Two prev SVD denies 3rd- or 4th-degree epis.  H/o hemorrhoids. Recently moved here, has been very active moving, and exercising.  Reports regular menstrual cycles, no Gyn c/o.    Victoria Shaw  has no past medical history on file. .    OB History   Gravida Para Term Preterm AB SAB TAB Ectopic Multiple Living   3 2 2  1 1    2       # Outcome Date GA Lbr Len/2nd Weight Sex Delivery Anes PTL Lv   3 SAB            2 Term            1 Term                     Her surgeries include  has a past surgical history that includes wisdom teeth extraction and dilation and curettage..    Her current meds are   No current outpatient prescriptions on file prior to visit.     No current facility-administered medications on file prior to visit.          Family history is significant for family history includes Heart Disease in her father. There is no history of Breast Cancer, Colon Cancer, or Ovarian Cancer.Victoria Shaw.    Victoria Shaw  reports that she has quit smoking. She has never used smokeless tobacco. She reports that she does not drink alcohol or use illicit drugs.     Blood pressure 112/76, height 5\' 2"  (1.575 m), weight 123 lb (55.8 kg), last menstrual period 07/16/2015.   A&Ox3, NAD  Pelvic NL EGBUS and vagina.  2cm sebaceous appearing cyst near anus, contiguous with an old hemorrhoid.  No thormbosed hemorrhoids.  No evidence of fistula    Victoria Shaw was seen today for cyst.    Diagnoses and all orders for this visit:    Residual hemorrhoidal skin tags  -     hydrocortisone (ANUSOL-HC) 2.5 % rectal cream; Insert  into rectum four (4) times daily.    Sebaceous cyst - warm soaks with EPSOM salts BID. Try to avoid I&D         Follow-up Disposition:  Return in about 3 days (around 08/08/2015) for recheck. Unless resolved

## 2015-08-08 ENCOUNTER — Encounter: Attending: Obstetrics & Gynecology | Primary: Internal Medicine

## 2015-10-20 ENCOUNTER — Ambulatory Visit
Admit: 2015-10-20 | Discharge: 2015-10-20 | Payer: PRIVATE HEALTH INSURANCE | Attending: Obstetrics & Gynecology | Primary: Internal Medicine

## 2015-10-20 DIAGNOSIS — Z01419 Encounter for gynecological examination (general) (routine) without abnormal findings: Secondary | ICD-10-CM

## 2015-10-20 LAB — AMB POC URINALYSIS DIP STICK MANUAL W/O MICRO

## 2015-10-20 LAB — AMB POC OCCULT, OTHER SOURCE: Hemoccult (POC): NEGATIVE

## 2015-10-20 LAB — AMB POC HEMOGLOBIN (HGB): Hemoglobin (POC): 13.8

## 2015-10-20 NOTE — Progress Notes (Signed)
HISTORY OF PRESENT ILLNESS  Victoria Shaw is a 45 y.o. female. , G3 P2012 , who presents today for a routine annual gynecological examination.Patient's last menstrual period was 10/04/2015 (exact date). . Hemorrhoids a little better.  Cycles are a few days closer lately, no pain, and actually a little shorter. Some hot flashes, rare night sweats.  Also noting lots of gas, nausea and bloating. May have IBS (following up with PCP soon)    Hgb: 13.8    U/A: WNL  Last pap smear taken on 10/2014 - pt reported  Last Pap smear result: normal  Last MGM 10/2014 - pt reported, Result WNL   Last Colo NA    OB History:   OB History   Gravida Para Term Preterm AB Living   3 2 2  1 2    SAB TAB Ectopic Molar Multiple Live Births   1           # Outcome Date GA Lbr Len/2nd Weight Sex Delivery Anes PTL Lv   3 SAB            2 Term            1 Term                   Health Maintenance Due   Topic Date Due   ??? Cervical Cancer Screening  12/21/1989   ??? Flu Vaccine  10/04/2015       GYN History     Contraception  Current form of contraception: None  Previous forms of contracpetion: OCP's  Menstrual History  Menarche: 12  Period cycle: 28 - 30  Period duration in days: 6  Period pattern: Regular  Menstrual flow: Moderate  Period control: Tampon regular, Maxi pad  Frequency of change for flow control: 3 times a day  Dysmenorrhea: (!) Moderate  Dysmenorrhea symptoms: Cramping, Nausea, Headache  Dyspareunia: None  Post-coital bleeding: None  STD History  STD History: None      Contraception: none    Victoria Shaw  has no past medical history on file. [name].    Her surgeries include  has a past surgical history that includes wisdom teeth extraction and dilation and curettage..    No Known Allergies     Her current meds are   Current Outpatient Prescriptions on File Prior to Visit   Medication Sig Dispense Refill   ??? B.infantis-B.ani-B.long-B.bifi (PROBIOTIC 4X) 10-15 mg TbEC Take  by mouth.     ??? ibuprofen (MOTRIN) 200 mg tablet Take  by mouth.        No current facility-administered medications on file prior to visit.          Family history is significant for family history includes Heart Disease in her father. There is no history of Breast Cancer, Colon Cancer, or Ovarian Cancer.Victoria Blonder.    Victoria Shaw  reports that she has quit smoking. She has never used smokeless tobacco. She reports that she does not drink alcohol or use illicit drugs.     She completed her Systems Review which is documented below.  I tried to relate any problem to gyn systems and if not related she is referred to her PCP    Blood pressure 120/80, height 5\' 2"  (1.575 m), weight 128 lb (58.1 kg), last menstrual period 10/04/2015.   Body mass index is 23.41 kg/(m^2).   HPI    Review of Systems   Constitutional: Negative.  Negative for fever, malaise/fatigue and weight loss.   Respiratory:  Negative.    Cardiovascular: Negative.    Gastrointestinal: Positive for heartburn and nausea (w stress). Negative for blood in stool, constipation and diarrhea.   Genitourinary: Negative.  Negative for dysuria, frequency (I.C.), hematuria and urgency.   Musculoskeletal: Negative.  Negative for falls.   Skin: Negative.    Neurological: Positive for headaches.   Endo/Heme/Allergies: Negative.    Psychiatric/Behavioral: Negative.  Negative for depression. The patient is not nervous/anxious.    All other systems reviewed and are negative.    Physical Exam   Constitutional: She is oriented to person, place, and time. She appears well-developed and well-nourished.   HENT:   Head: Normocephalic.   Neck: Normal range of motion.   Pulmonary/Chest: Effort normal. Right breast exhibits no mass, no nipple discharge, no skin change and no tenderness. Left breast exhibits no mass, no nipple discharge, no skin change and no tenderness. Breasts are symmetrical.   Bilateral FC densities, worse on left   Abdominal: Soft. She exhibits no mass. There is no tenderness. There is no rebound and no guarding.    Genitourinary: Vagina normal and uterus normal. Rectal exam shows external hemorrhoid and internal hemorrhoid. Rectal exam shows anal tone normal and guaiac negative stool. No breast discharge. Pelvic exam was performed with patient supine. There is no rash or tenderness on the right labia. There is no rash or tenderness on the left labia. Cervix exhibits no motion tenderness. Right adnexum displays no mass and no tenderness. Left adnexum displays no mass and no tenderness.   Musculoskeletal: Normal range of motion.   Neurological: She is alert and oriented to person, place, and time.   Skin: Skin is warm and dry.   Psychiatric: She has a normal mood and affect. Her behavior is normal. Judgment and thought content normal.   Vitals reviewed.      ASSESSMENT and PLAN  Diagnoses and all orders for this visit:    1. Well woman exam with routine gynecological exam    2. Screening for iron deficiency anemia  -     AMB POC HEMOGLOBIN (HGB)    3. Screening for hematuria or proteinuria  -     AMB POC URINALYSIS DIP STICK MANUAL W/O MICRO    4. Encounter for Hemoccult screening  -     AMB POC OCCULT, OTHER SOURCE    5. Screening for breast cancer  -     MAM MAMMO BI SCREENING INCL CAD; Future    6. Screening for cervical cancer  -     PAP, LB, RFX HPV [id number])    7. Irritable bowel syndrome, unspecified type    8. Perimenopausal symptoms - options reviewed, will try Estroven    9. FC breasts, recommend Tomosynthesis when schedules. Try evening primrose    Follow-up Disposition:  Return in about 1 year (around 10/19/2016) for Annual.

## 2015-10-23 LAB — PAP, LB, RFX HPV ASCUS(507301)
.: 0
LABCORP 019018: 0

## 2015-10-25 ENCOUNTER — Ambulatory Visit: Payer: PRIVATE HEALTH INSURANCE | Primary: Internal Medicine

## 2015-10-28 NOTE — Telephone Encounter (Signed)
Patient had questions about her pap. Questions answered.

## 2015-11-08 ENCOUNTER — Inpatient Hospital Stay: Admit: 2015-11-08 | Payer: PRIVATE HEALTH INSURANCE | Attending: Obstetrics & Gynecology | Primary: Internal Medicine

## 2015-11-08 ENCOUNTER — Encounter

## 2015-11-08 DIAGNOSIS — Z1231 Encounter for screening mammogram for malignant neoplasm of breast: Secondary | ICD-10-CM

## 2015-11-10 ENCOUNTER — Encounter

## 2015-11-14 ENCOUNTER — Inpatient Hospital Stay: Admit: 2015-11-14 | Payer: PRIVATE HEALTH INSURANCE | Attending: Obstetrics & Gynecology | Primary: Internal Medicine

## 2015-11-14 DIAGNOSIS — R928 Other abnormal and inconclusive findings on diagnostic imaging of breast: Secondary | ICD-10-CM

## 2015-11-15 NOTE — Telephone Encounter (Signed)
TC about her mammogram and right breast ultrasound results.  Pt wanted to be sure EPH was aware of ultrasound that was needed.  Results were sent to pt by [facility].

## 2015-11-18 NOTE — Telephone Encounter (Signed)
Patient called and left message.  Returned call but no answer.  Left message

## 2015-11-21 NOTE — Telephone Encounter (Signed)
Patient advised that her pap smear was normal.  Patient verbalized understanding.

## 2016-01-24 NOTE — Telephone Encounter (Signed)
Pt had 11/08/15 mammogram and 11/14/15 right breast ultrasound for a right breast cyst.  Now c/o right breast cyst is larger.  Per EPH, he can see pt for a cyst aspiration or can refer pt to [facility].  Pt wants to see EPH.  appt scheduled for 12/4.  Will check for any sooner cancellations.

## 2016-02-01 NOTE — Telephone Encounter (Signed)
Pt has Monday, 12/4 appt w/ EPH for a possible breast cyst aspiration.  Wanted to know if he had any cancellations for today.  No appt available today, per his nurse.  He is out of the office the rest of the week so pt will keep her appt on Monday.

## 2016-02-06 ENCOUNTER — Ambulatory Visit
Admit: 2016-02-06 | Discharge: 2016-02-06 | Payer: PRIVATE HEALTH INSURANCE | Attending: Obstetrics & Gynecology | Primary: Internal Medicine

## 2016-02-06 DIAGNOSIS — N6001 Solitary cyst of right breast: Secondary | ICD-10-CM

## 2016-02-06 NOTE — Progress Notes (Signed)
Victoria Shaw  is a 45 y.o. G3 P2012  who is seen for painful lump in the right breast.  Onset was 1 week ago. Symptoms have been gradually worsening since. Has had these in the past and had FNA before. Denies nipples discharge.. Patient's last menstrual period was 01/22/2016. [name]. [facility] showed a cluster of cysts in the RUOQ.    HISTORY:  Personal Breast History:  FC breasts  Family Breast History: She family history includes Heart Disease in her father. There is no history of Breast Cancer, Colon Cancer, or Ovarian Cancer.    She  reports that she has quit smoking. She has never used smokeless tobacco.      Current Outpatient Prescriptions:   ???  tiZANidine (ZANAFLEX) 4 mg capsule, Take 4 mg by mouth three (3) times daily., Disp: , Rfl:   ???  fexofenadine (ALLEGRA) 180 mg tablet, Take  by mouth., Disp: , Rfl:   ???  fluticasone (FLONASE) 50 mcg/actuation nasal spray, 2 Sprays by Both Nostrils route daily., Disp: , Rfl:   ???  B.infantis-B.ani-B.long-B.bifi (PROBIOTIC 4X) 10-15 mg TbEC, Take  by mouth., Disp: , Rfl:   ???  ibuprofen (MOTRIN) 200 mg tablet, Take  by mouth., Disp: , Rfl:        ROS:  Gyn ROS: normal menses, no abnormal bleeding, pelvic pain or discharge    PHYSICAL EXAM:  Blood pressure 110/62, weight 130 lb (59 kg), last menstrual period 01/22/2016.  The patient appears well, alert, oriented x 3, in no distress. Normal thought process and judgement.   Breasts:tender mass RUOQ, axillary adenopathy negative.  No skin or nipple changes.    ASSESSMENT & PLAN  Diagnoses and all orders for this visit:    1. Breast cyst, right  -     PR FINE NEEDLE ASP;W/O IMAGING GUIDANCE  -     FINE-NEEDLE ASPIRATION    BREAST CYST ASPIRATION      Date:  02/06/2016    Name:  Victoria Shaw [id number]     DOB:  1970/09/15    Age:  45 y.o.    LMP:  Patient's last menstrual period was 01/22/2016.     History:  FC breasts.  See above    Procedure:    Skin was cleansed with betadine.  Area over the mass was infiltrated with  plain 1% xylocaine.  Syringe with 20g needle was used to aspirate 1-2cc of greenish fluid.  This was sent for cytology.  After aspiration, the mass was not completely resolved, US showed a cluster of cysts.  Patient tolerated the procedure well.      Follow-up plan:     As indicated.        Tomasa HostellerEdward P Patryce Depriest, MD       Follow-up Disposition:  Return if symptoms worsen or fail to improve.

## 2016-02-07 LAB — FINE-NEEDLE ASPIRATION

## 2016-10-22 ENCOUNTER — Encounter: Attending: Obstetrics & Gynecology | Primary: Internal Medicine

## 2016-10-22 ENCOUNTER — Encounter

## 2016-10-22 ENCOUNTER — Telehealth

## 2016-10-22 NOTE — Telephone Encounter (Signed)
Patient called stating that she needs a sooner appointment than 11-22-16 with EPH.  Appointment made for 11-08-16.  Patient also needs an order for her mammogram.  Order in [facility].  Patient verbalized understanding.

## 2016-11-06 ENCOUNTER — Encounter: Attending: Obstetrics & Gynecology | Primary: Internal Medicine

## 2016-11-08 ENCOUNTER — Encounter: Attending: Obstetrics & Gynecology | Primary: Internal Medicine

## 2016-11-13 ENCOUNTER — Ambulatory Visit
Admit: 2016-11-13 | Discharge: 2016-11-13 | Payer: PRIVATE HEALTH INSURANCE | Attending: Obstetrics & Gynecology | Primary: Internal Medicine

## 2016-11-13 DIAGNOSIS — Z01419 Encounter for gynecological examination (general) (routine) without abnormal findings: Secondary | ICD-10-CM

## 2016-11-13 LAB — AMB POC URINALYSIS DIP STICK MANUAL W/O MICRO

## 2016-11-13 LAB — AMB POC OCCULT, OTHER SOURCE: Hemoccult (POC): NEGATIVE

## 2016-11-13 LAB — AMB POC HEMOGLOBIN (HGB): Hemoglobin (POC): 13.7

## 2016-11-13 NOTE — Progress Notes (Signed)
HISTORY OF PRESENT ILLNESS  Victoria Shaw is a 46 y.o. female.  G3 [id number] , who presents today for a routine annual gynecological examination.Patient's last menstrual period was 11/05/2016 (exact date). . CYcles getting a little erratic, but ow Doing well.  No Gyn, Breast, G/U, or GI complaints.     Hgb: 13.7    U/A: WNL    Mammogram/Pap Hx 11/13/2016   Mammogram Date 11/08/2015   Mammogram Result f/u images - WNL    Pap Date 10/20/2015   Pap Result WNL       Last Colo n/a    OB History:   OB History   Gravida Para Term Preterm AB Living   SAB TAB Ectopic Molar Multiple Live Births   1           # Outcome Date GA Lbr Len/2nd Weight Sex Delivery Anes PTL Lv   3 SAB            2 Term            1 Term                   Health Maintenance Due   Topic Date Due   ??? Flu Vaccine  10/03/2016       GYN History   Menstrual History  Menarche: 12  Period cycle: <27  Period duration in days: 7  Period pattern: Regular  Menstrual flow: Moderate  Period control: Tampon regular, Maxi pad, Thin pad  Frequency of change for flow control: 3 times a day  Dysmenorrhea: (!) Moderate  Dysmenorrhea symptoms: Cramping  Dyspareunia: None  Post-coital bleeding: None  STD History  STD History: None      Contraception: none    Victoria Shaw  has no past medical history on file. [name]    Her surgeries include  has a past surgical history that includes hx wisdom teeth extraction; hx dilation and curettage; and hx cyst incision and drainage..    No Known Allergies     Her current meds are   Current Outpatient Prescriptions on File Prior to Visit   Medication Sig Dispense Refill   ??? tiZANidine (ZANAFLEX) 4 mg capsule Take 4 mg by mouth three (3) times daily.     ??? fexofenadine (ALLEGRA) 180 mg tablet Take  by mouth.     ??? fluticasone (FLONASE) 50 mcg/actuation nasal spray 2 Sprays by Both Nostrils route daily.     ??? B.infantis-B.ani-B.long-B.bifi (PROBIOTIC 4X) 10-15 mg TbEC Take  by mouth.      ??? ibuprofen (MOTRIN) 200 mg tablet Take  by mouth.       No current facility-administered medications on file prior to visit.          Family history is significant for family history includes Heart Disease in her father. There is no history of Breast Cancer, Colon Cancer, or Ovarian Cancer.Victoria Shaw  reports that she has quit smoking. She has never used smokeless tobacco. She reports that she does not drink alcohol or use illicit drugs.     She completed her Systems Review which is documented below.  I tried to relate any problem to gyn systems and if not related she is referred to her PCP    Blood pressure 118/64, height  (1.575 m), weight 127 lb (57.6 kg), last menstrual period 11/05/2016.   Body mass index is 23.23 kg/(m^2).  HPI    Review of Systems   Constitutional: Negative.  Negative for fever, malaise/fatigue and weight loss.   Respiratory: Negative.    Cardiovascular: Negative.    Gastrointestinal: Negative.  Negative for blood in stool, constipation and diarrhea.   Genitourinary: Negative.  Negative for dysuria, frequency, hematuria and urgency.   Musculoskeletal: Negative.  Negative for falls.   Skin: Negative.    Neurological: Negative.    Endo/Heme/Allergies: Negative.    Psychiatric/Behavioral: Negative.  Negative for depression. The patient is not nervous/anxious.    All other systems reviewed and are negative.      Physical Exam   Constitutional: She is oriented to person, place, and time. She appears well-developed and well-nourished.   HENT:   Head: Normocephalic.   Neck: Normal range of motion.   Pulmonary/Chest: Effort normal. Right breast exhibits no mass, no nipple discharge, no skin change and no tenderness. Left breast exhibits no mass, no nipple discharge, no skin change and no tenderness. Breasts are symmetrical.   Very heterogeneous bilaterally   Abdominal: Soft. She exhibits no mass. There is no tenderness. There is no rebound and no guarding.    Genitourinary: Rectum normal, vagina normal and uterus normal. Rectal exam shows guaiac negative stool. No breast discharge. Pelvic exam was performed with patient supine. There is no rash or tenderness on the right labia. There is no rash or tenderness on the left labia. Cervix exhibits no motion tenderness. Right adnexum displays no mass and no tenderness. Left adnexum displays no mass and no tenderness.   Musculoskeletal: Normal range of motion.   Neurological: She is alert and oriented to person, place, and time.   Skin: Skin is warm and dry.   Psychiatric: She has a normal mood and affect. Her behavior is normal. Judgment and thought content normal.   Vitals reviewed.      ASSESSMENT and PLAN  Diagnoses and all orders for this visit:    1. Well woman exam with routine gynecological exam - [facility]GM previous ordered, scheduled for 9/22.    2. Screening for iron deficiency anemia  -     AMB POC HEMOGLOBIN (HGB)    3. Screening for hematuria or proteinuria  -     AMB POC URINALYSIS DIP STICK MANUAL W/O MICRO    4. Encounter for Hemoccult screening  -     AMB POC OCCULT, OTHER SOURCE    5. Screening for cervical cancer  -     PAP, LB, RFX HPV [id number])    6. Perimenopausal symptoms - can consider low dose OCs for cycle irregularity, r/b/a discussed will manage conservatively for now.      Follow-up Disposition:  Return in about 1 year (around 11/13/2017) for Annual.  reviewed diet, exercise and weight control

## 2016-11-16 ENCOUNTER — Encounter: Attending: Obstetrics & Gynecology | Primary: Internal Medicine

## 2016-11-16 LAB — PAP, LB, RFX HPV ASCUS(507301)
.: 0
LABCORP 019018: 0

## 2016-11-22 ENCOUNTER — Encounter: Attending: Obstetrics & Gynecology | Primary: Internal Medicine

## 2016-11-23 NOTE — Telephone Encounter (Signed)
Patient called regarding pap smear results.  Patient advised that her pap smear was normal.  Patient verbalized understanding.

## 2016-11-24 ENCOUNTER — Inpatient Hospital Stay: Admit: 2016-11-24 | Payer: PRIVATE HEALTH INSURANCE | Attending: Obstetrics & Gynecology | Primary: Internal Medicine

## 2016-11-24 DIAGNOSIS — Z1231 Encounter for screening mammogram for malignant neoplasm of breast: Secondary | ICD-10-CM

## 2016-11-27 ENCOUNTER — Encounter

## 2016-11-28 ENCOUNTER — Inpatient Hospital Stay: Admit: 2016-11-28 | Payer: PRIVATE HEALTH INSURANCE | Attending: Obstetrics & Gynecology | Primary: Internal Medicine

## 2016-11-28 DIAGNOSIS — N6002 Solitary cyst of left breast: Secondary | ICD-10-CM

## 2017-01-17 IMAGING — CR DG CERVICAL SPINE COMPLETE 4+V
1 series · 6 of 6 positions shown · non-contrast
Comparison: None.

CLINICAL DATA: Neck pain.

EXAM:
CERVICAL SPINE  4+ VIEWS

[Series 1: dg cervical spine complete · 0.14mm/px · 6 of 6 slices shown]
[im 1/6]
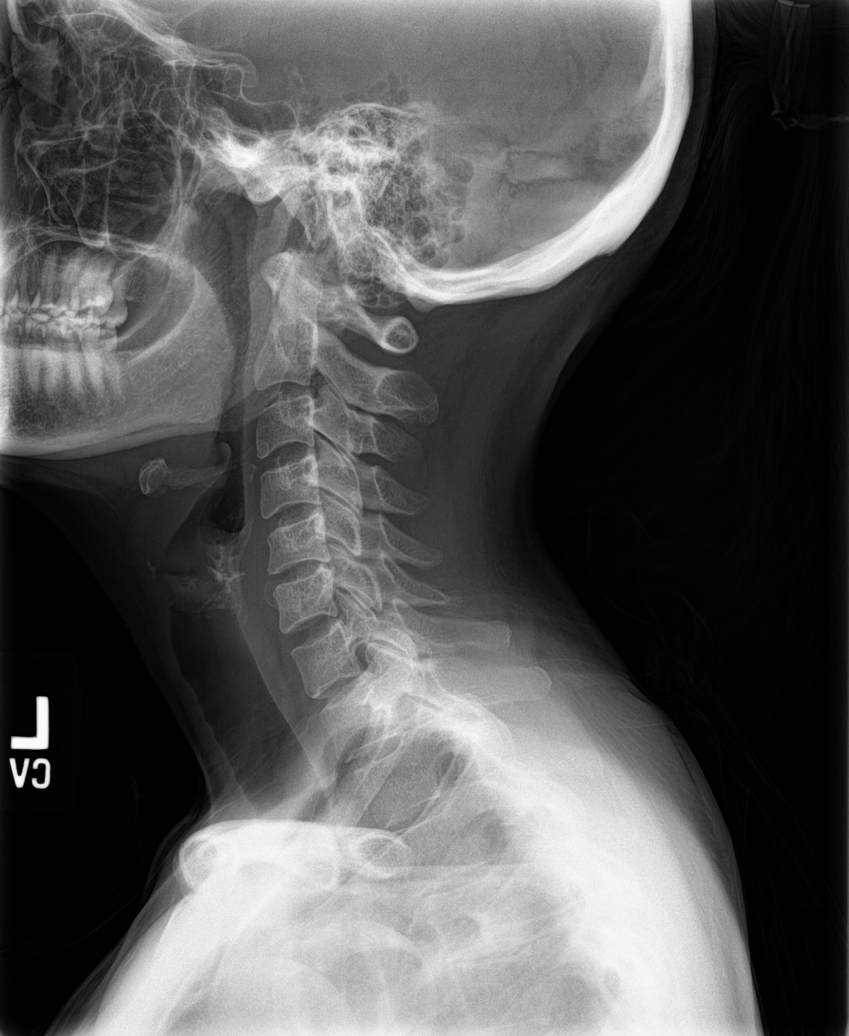
[im 2/6]
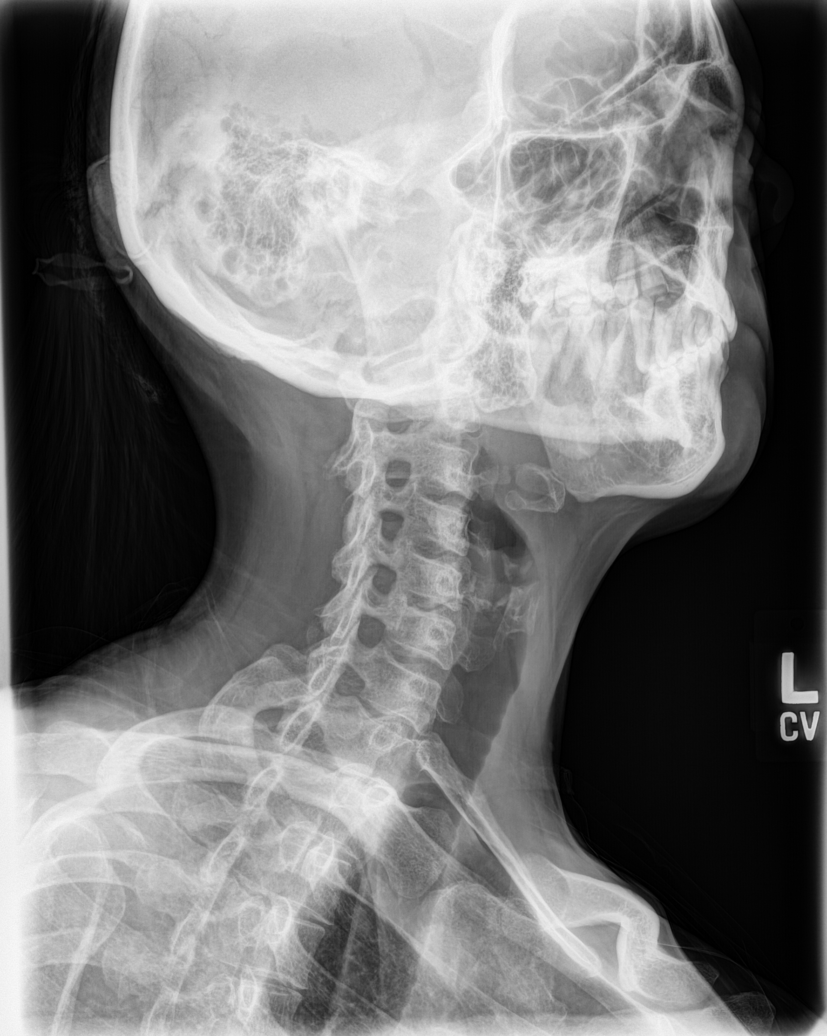
[im 3/6]
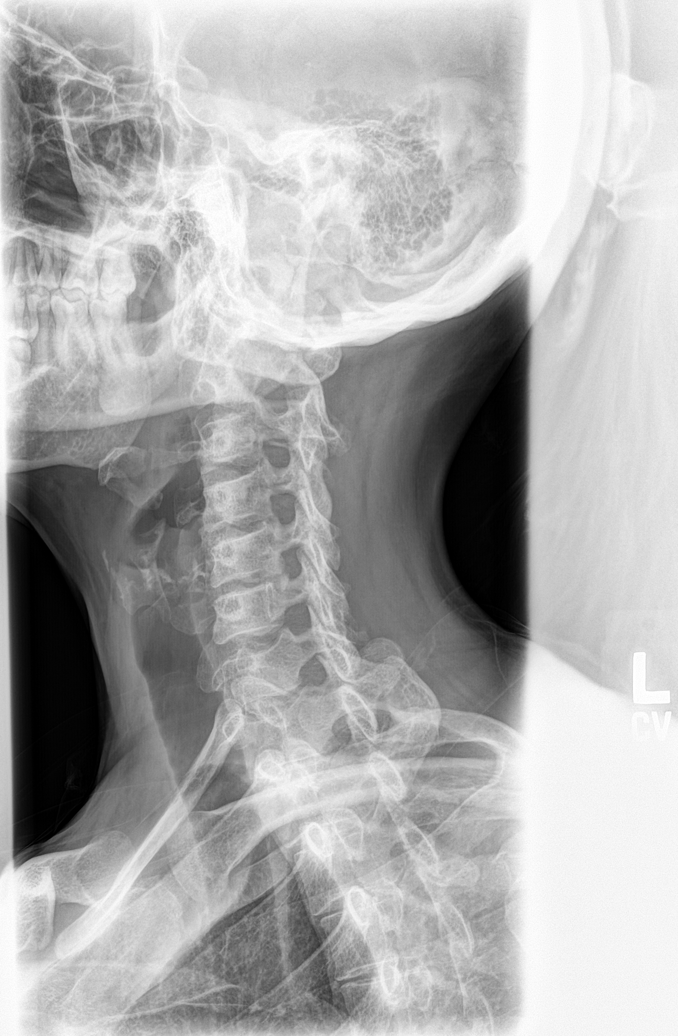
[im 4/6]
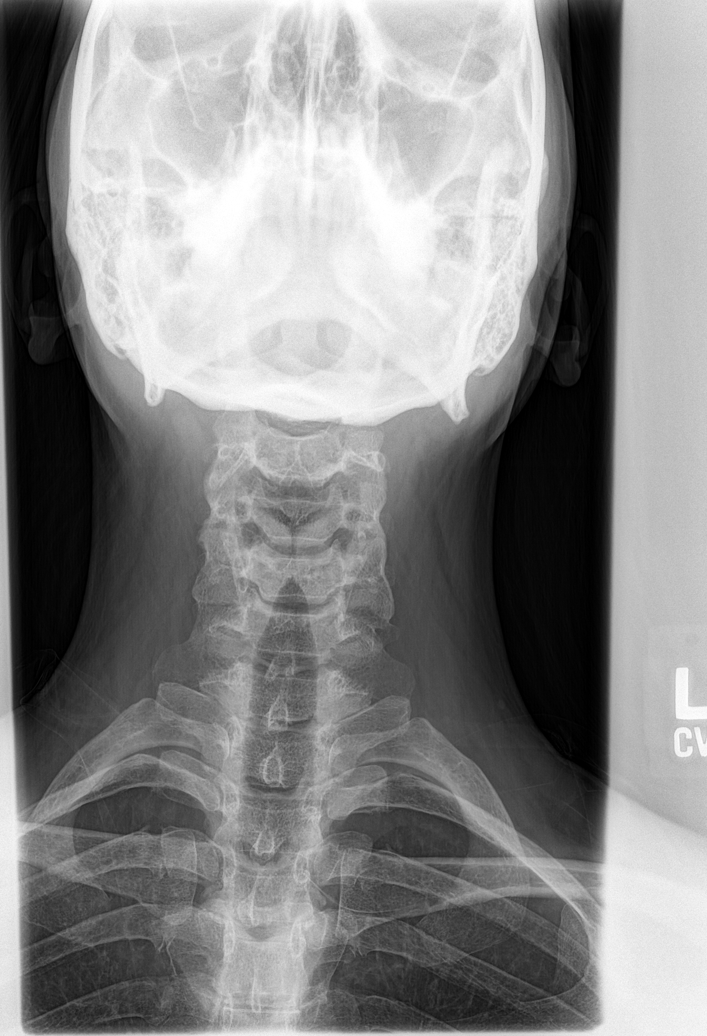
[im 5/6]
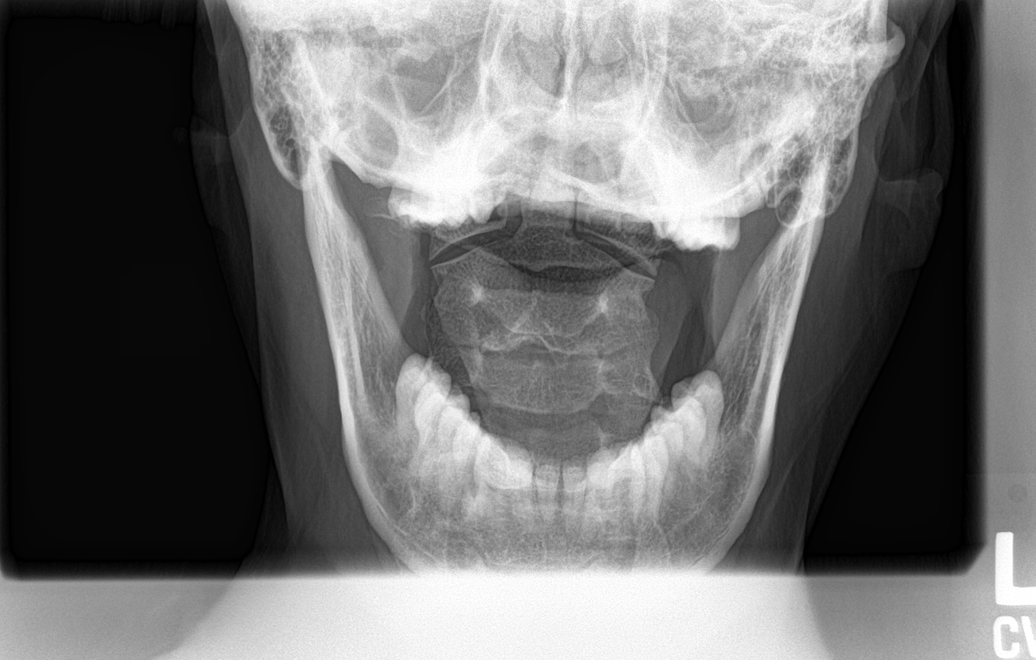
[im 6/6]
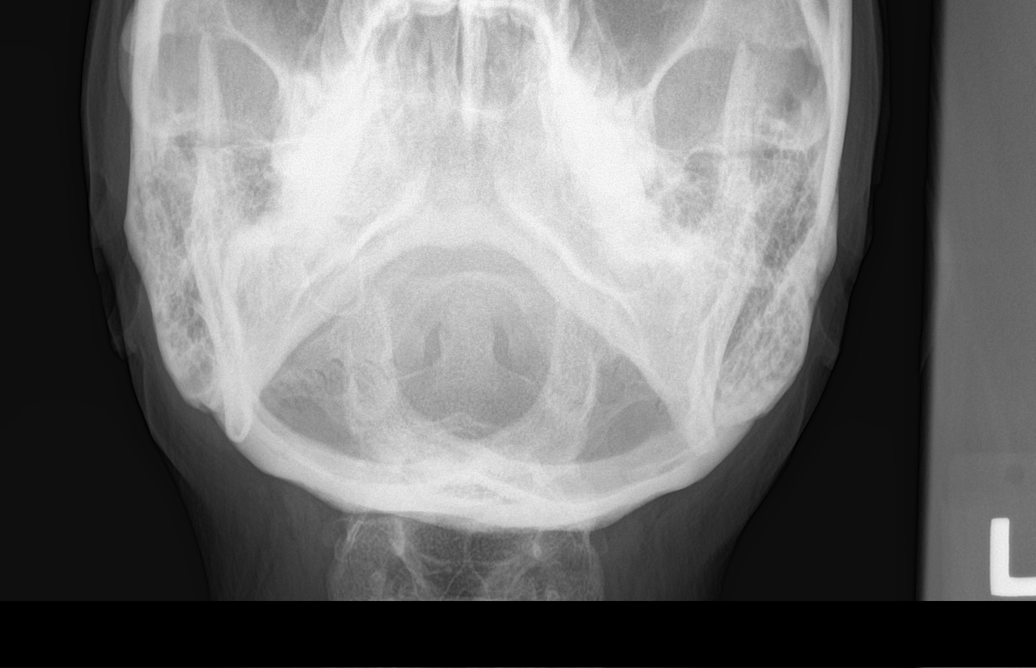

[6 of 6 positions shown; findings below may reference images not displayed]

FINDINGS: There is no evidence of cervical spine fracture or prevertebral soft
tissue swelling. Alignment is normal. No other significant bone
abnormalities are identified.
IMPRESSION: Negative cervical spine radiographs.

## 2017-09-18 ENCOUNTER — Encounter

## 2017-11-14 ENCOUNTER — Encounter: Attending: Obstetrics & Gynecology | Primary: Internal Medicine

## 2017-11-21 ENCOUNTER — Ambulatory Visit
Admit: 2017-11-21 | Discharge: 2017-11-21 | Payer: PRIVATE HEALTH INSURANCE | Attending: Obstetrics & Gynecology | Primary: Internal Medicine

## 2017-11-21 ENCOUNTER — Ambulatory Visit: Attending: Obstetrics & Gynecology | Primary: Internal Medicine

## 2017-11-21 DIAGNOSIS — Z01419 Encounter for gynecological examination (general) (routine) without abnormal findings: Secondary | ICD-10-CM

## 2017-11-21 LAB — AMB POC URINALYSIS DIP STICK MANUAL W/O MICRO

## 2017-11-21 LAB — AMB POC HEMOGLOBIN (HGB)
Hemoglobin (POC): 13.9
Hemoglobin, POC: 13.9 NA

## 2017-11-21 LAB — AMB POC OCCULT, OTHER SOURCE
Fecal Occult Blood, POC: NEGATIVE
Hemoccult (POC): NEGATIVE

## 2017-11-21 NOTE — Progress Notes (Signed)
HISTORY OF PRESENT ILLNESS  Victoria Shaw is a 47 y.o. female.   G3 [id number] , who presents today for a routine annual gynecological examination.Patient's last menstrual period was 11/08/2017 (exact date). . H/o FC breasts and cyclic breast tenderness but ow Doing well.  No Gyn, Breast, G/U, or GI complaints. Has a known cyst in the left breast (see MGM last year.) Cycles regular.  Occasional hot flash only.  [facility]GM sched for next week.    Hgb: 13.8    U/A: WNL    Mammogram/Pap Hx 11/21/2017 11/13/2016   Mammogram Date 11/24/2016 11/08/2015   Mammogram Result additional images needed -left breast ultrasound WNL f/u images - WNL    Pap Date 11/13/2016 10/20/2015   Pap Result WNL WNL       Last Colo NA  See Victoria Shaw @ [facility].    OB History:   OB History   Gravida Para Term Preterm AB Living   3 2 2   1 2    SAB TAB Ectopic Molar Multiple Live Births   1                # Outcome Date GA Lbr Len/2nd Weight Sex Delivery Anes PTL Lv   3 SAB            2 Term            1 Term                Health Maintenance Due   Topic Date Due   ??? Flu Vaccine  10/03/2017       GYN History     Menstrual History  Menarche: 12  Period cycle: 28 - 30  Period duration in days: 6  Period pattern: Regular  Menstrual flow: Moderate  Period control: Tampon regular, Maxi pad, Thin pad  Frequency of change for flow control: 3 times a day  Dysmenorrhea: (!) Moderate  Dysmenorrhea symptoms: Cramping  Dyspareunia: None  Post-coital bleeding: None  STD History  STD History: None      Contraception: none    Victoria Shaw  has no past medical history on file. [name]    Her surgeries include  has a past surgical history that includes hx wisdom teeth extraction; hx dilation and curettage; and hx cyst incision and drainage..    No Known Allergies     Her current meds are:   Current Outpatient Medications   Medication Sig   ??? melatonin 5 mg cap capsule Take 5 mg by mouth nightly.   ??? fexofenadine (ALLEGRA) 180 mg tablet Take  by mouth.    ??? fluticasone (FLONASE) 50 mcg/actuation nasal spray 2 Sprays by Both Nostrils route daily.   ??? B.infantis-B.ani-B.long-B.bifi (PROBIOTIC 4X) 10-15 mg TbEC Take  by mouth.   ??? ibuprofen (MOTRIN) 200 mg tablet Take  by mouth.     No current facility-administered medications for this visit.           Family history is significant for family history includes Heart Disease in her father.Victoria Shaw  reports that she has quit smoking. She has never used smokeless tobacco. She reports that she does not drink alcohol or use drugs.     She completed her Systems Review which is documented below.  I tried to relate any problem to gyn systems and if not related she is referred to her PCP    Blood pressure 124/78, height 5\' 2"  (1.575 m), weight 129 lb (58.5 kg), last menstrual  period 11/08/2017.   Body mass index is 23.59 kg/m??.         HPI    Review of Systems   Constitutional: Negative.  Negative for fever, malaise/fatigue and weight loss.   Respiratory: Negative.    Cardiovascular: Negative.    Gastrointestinal: Negative.  Negative for abdominal pain, blood in stool, constipation, diarrhea, nausea and vomiting.   Genitourinary: Negative.  Negative for dysuria, frequency, hematuria and urgency.   Musculoskeletal: Negative.  Negative for falls.   Skin: Negative.    Neurological: Negative.    Endo/Heme/Allergies: Negative.    Psychiatric/Behavioral: Negative.  Negative for depression. The patient is not nervous/anxious.    All other systems reviewed and are negative.      Physical Exam   Constitutional: She is oriented to person, place, and time. She appears well-developed and well-nourished.   HENT:   Head: Normocephalic.   Neck: Normal range of motion.   Pulmonary/Chest: Effort normal. Right breast exhibits tenderness. Right breast exhibits no inverted nipple, no mass, no nipple discharge and no skin change. Left breast exhibits tenderness. Left breast exhibits no  inverted nipple, no mass, no nipple discharge and no skin change. No breast discharge. Breasts are symmetrical.   heterogeneously dense bilat   Abdominal: Soft. She exhibits no mass. There is no tenderness. There is no rebound and no guarding.   Genitourinary: Rectum normal, vagina normal and uterus normal. Rectal exam shows guaiac negative stool. No breast discharge. Pelvic exam was performed with patient supine. There is no rash or tenderness on the right labia. There is no rash or tenderness on the left labia. Cervix exhibits no motion tenderness. Right adnexum displays no mass and no tenderness. Left adnexum displays no mass and no tenderness.   Musculoskeletal: Normal range of motion.   Neurological: She is alert and oriented to person, place, and time.   Skin: Skin is warm and dry.   Psychiatric: She has a normal mood and affect. Her behavior is normal. Judgment and thought content normal.   Vitals reviewed.      ASSESSMENT and PLAN  Diagnoses and all orders for this visit:    1. Well woman exam with routine gynecological exam    2. Screening for iron deficiency anemia  -     AMB POC HEMOGLOBIN (HGB)    3. Screening for hematuria or proteinuria  -     AMB POC URINALYSIS DIP STICK MANUAL W/O MICRO    4. Screen for colon cancer  -     AMB POC OCCULT, OTHER SOURCE    5. Screening for cervical cancer  -     PAP IG, RFX APTIMA HPV ASCUS ([id number]))    6. Screening for breast cancer      Follow-up and Dispositions    ?? Return in about 1 year (around 11/22/2018) for Annual.       reviewed diet, exercise and weight control

## 2017-11-21 NOTE — Progress Notes (Signed)
HISTORY OF PRESENT ILLNESS  Victoria Shaw is a 47 y.o. female.   G3 [id number] , who presents today for a routine annual gynecological examination.Patient's last menstrual period was 11/08/2017 (exact date). . H/o FC breasts and cyclic breast tenderness but ow Doing well.  No Gyn, Breast, G/U, or GI complaints. Has a known cyst in the left breast (see MGM last year.) Cycles regular.  Occasional hot flash only.  MGM sched for next week.    Hgb: 13.8    U/A: WNL    Mammogram/Pap Hx 11/21/2017 11/13/2016   Mammogram Date 11/24/2016 11/08/2015   Mammogram Result additional images needed -left breast ultrasound WNL f/u images - WNL    Pap Date 11/13/2016 10/20/2015   Pap Result WNL WNL       Last Colo NA  See Verita LambMelanie Greene @ [facility].    OB History:   OB History   Gravida Para Term Preterm AB Living   3 2 2   1 2    SAB TAB Ectopic Molar Multiple Live Births   1                # Outcome Date GA Lbr Len/2nd Weight Sex Delivery Anes PTL Lv   3 SAB            2 Term            1 Term                Health Maintenance Due   Topic Date Due   ??? Flu Vaccine  10/03/2017       GYN History     Menstrual History  Menarche: 12  Period cycle: 28 - 30  Period duration in days: 6  Period pattern: Regular  Menstrual flow: Moderate  Period control: Tampon regular, Maxi pad, Thin pad  Frequency of change for flow control: 3 times a day  Dysmenorrhea: (!) Moderate  Dysmenorrhea symptoms: Cramping  Dyspareunia: None  Post-coital bleeding: None  STD History  STD History: None      Contraception: none    Victoria BlonderDenise  has no past medical history on file. [name].    Her surgeries include  has a past surgical history that includes hx wisdom teeth extraction; hx dilation and curettage; and hx cyst incision and drainage..    No Known Allergies     Her current meds are:   Current Outpatient Medications   Medication Sig   ??? melatonin 5 mg cap capsule Take 5 mg by mouth nightly.   ??? fexofenadine (ALLEGRA) 180 mg tablet Take  by mouth.   ??? fluticasone (FLONASE) 50  mcg/actuation nasal spray 2 Sprays by Both Nostrils route daily.   ??? B.infantis-B.ani-B.long-B.bifi (PROBIOTIC 4X) 10-15 mg TbEC Take  by mouth.   ??? ibuprofen (MOTRIN) 200 mg tablet Take  by mouth.     No current facility-administered medications for this visit.           Family history is significant for family history includes Heart Disease in her father.Victoria Shaw.    Dalilah  reports that she has quit smoking. She has never used smokeless tobacco. She reports that she does not drink alcohol or use drugs.     She completed her Systems Review which is documented below.  I tried to relate any problem to gyn systems and if not related she is referred to her PCP    Blood pressure 124/78, height 5\' 2"  (1.575 m), weight 129 lb (58.5 kg), last menstrual  period 11/08/2017.   Body mass index is 23.59 kg/m??.         HPI    Review of Systems   Constitutional: Negative.  Negative for fever, malaise/fatigue and weight loss.   Respiratory: Negative.    Cardiovascular: Negative.    Gastrointestinal: Negative.  Negative for abdominal pain, blood in stool, constipation, diarrhea, nausea and vomiting.   Genitourinary: Negative.  Negative for dysuria, frequency, hematuria and urgency.   Musculoskeletal: Negative.  Negative for falls.   Skin: Negative.    Neurological: Negative.    Endo/Heme/Allergies: Negative.    Psychiatric/Behavioral: Negative.  Negative for depression. The patient is not nervous/anxious.    All other systems reviewed and are negative.      Physical Exam   Constitutional: She is oriented to person, place, and time. She appears well-developed and well-nourished.   HENT:   Head: Normocephalic.   Neck: Normal range of motion.   Pulmonary/Chest: Effort normal. Right breast exhibits tenderness. Right breast exhibits no inverted nipple, no mass, no nipple discharge and no skin change. Left breast exhibits tenderness. Left breast exhibits no inverted nipple, no mass, no nipple discharge and no skin change. No breast discharge.  Breasts are symmetrical.   heterogeneously dense bilat   Abdominal: Soft. She exhibits no mass. There is no tenderness. There is no rebound and no guarding.   Genitourinary: Rectum normal, vagina normal and uterus normal. Rectal exam shows guaiac negative stool. No breast discharge. Pelvic exam was performed with patient supine. There is no rash or tenderness on the right labia. There is no rash or tenderness on the left labia. Cervix exhibits no motion tenderness. Right adnexum displays no mass and no tenderness. Left adnexum displays no mass and no tenderness.   Musculoskeletal: Normal range of motion.   Neurological: She is alert and oriented to person, place, and time.   Skin: Skin is warm and dry.   Psychiatric: She has a normal mood and affect. Her behavior is normal. Judgment and thought content normal.   Vitals reviewed.      ASSESSMENT and PLAN  Diagnoses and all orders for this visit:    1. Well woman exam with routine gynecological exam    2. Screening for iron deficiency anemia  -     AMB POC HEMOGLOBIN (HGB)    3. Screening for hematuria or proteinuria  -     AMB POC URINALYSIS DIP STICK MANUAL W/O MICRO    4. Screen for colon cancer  -     AMB POC OCCULT, OTHER SOURCE    5. Screening for cervical cancer  -     PAP IG, RFX APTIMA HPV ASCUS ([id number]))    6. Screening for breast cancer      Follow-up and Dispositions    ?? Return in about 1 year (around 11/22/2018) for Annual.       reviewed diet, exercise and weight control

## 2017-11-26 ENCOUNTER — Inpatient Hospital Stay: Admit: 2017-11-26 | Payer: PRIVATE HEALTH INSURANCE | Attending: Obstetrics & Gynecology | Primary: Internal Medicine

## 2017-11-26 DIAGNOSIS — Z1231 Encounter for screening mammogram for malignant neoplasm of breast: Secondary | ICD-10-CM

## 2017-11-26 LAB — PAP IG, RFX APTIMA HPV ASCUS (507800)
.: 0
LABCORP 019018: 0

## 2017-11-27 NOTE — Telephone Encounter (Signed)
TC for 9/19 pap results- wnl.  Also asked about her mammogram results- done yesterday at [facility].  No results available yet.Victoria Shaw  Pt of Dr. Sheran Spine

## 2018-08-12 ENCOUNTER — Encounter

## 2018-09-29 NOTE — Telephone Encounter (Signed)
TC from pt c/o menses coming every 3 weeks and spotting between periods.  Pt requested to be seen before she goes out of town at the end of the week.  Appt scheduled for 7:05 tomorrow (only available over next few days).  Pt notified.

## 2018-09-30 ENCOUNTER — Ambulatory Visit
Admit: 2018-09-30 | Discharge: 2018-09-30 | Payer: PRIVATE HEALTH INSURANCE | Attending: Obstetrics & Gynecology | Primary: Internal Medicine

## 2018-09-30 ENCOUNTER — Ambulatory Visit: Attending: Obstetrics & Gynecology | Primary: Internal Medicine

## 2018-09-30 DIAGNOSIS — N924 Excessive bleeding in the premenopausal period: Secondary | ICD-10-CM

## 2018-09-30 LAB — AMB POC URINALYSIS DIP STICK MANUAL W/O MICRO

## 2018-09-30 NOTE — Progress Notes (Signed)
Victoria Shaw presents with new onset irregular spotting.  Over the past year cycles have increased to about q 3 weeks.  Also has occasional very faint mid cycle spotting.  Last week however had several days of spotting and twinges of pain in the right side off and on.  Agg none allev none radiate none. No actual pain, no dyspareunia. NO dysuria/hematuria.  H/o IC, admits taking in a lot more caffeine lately. Is getting up a lot more at night to urinate.  Denies significant hot flashes or night sweats.  Exercising more but unable to lose weight  Victoria Shaw  has a past medical history of Interstitial cystitis. .    OB History   Gravida Para Term Preterm AB Living   3 2 2   1 2    SAB TAB Ectopic Molar Multiple Live Births   1                # Outcome Date GA Lbr Len/2nd Weight Sex Delivery Anes PTL Lv   3 SAB            2 Term            1 Term                  Her surgeries include  has a past surgical history that includes hx wisdom teeth extraction; hx dilation and curettage; hx cyst incision and drainage; and hx breast biopsy.[name].    Her current meds are   Current Outpatient Medications on File Prior to Visit   Medication Sig Dispense Refill   ??? Cetirizine (ZyrTEC) 10 mg cap Take  by mouth.     ??? melatonin 5 mg cap capsule Take 5 mg by mouth nightly.     ??? fluticasone (FLONASE) 50 mcg/actuation nasal spray 2 Sprays by Both Nostrils route daily.     ??? B.infantis-B.ani-B.long-B.bifi (PROBIOTIC 4X) 10-15 mg TbEC Take  by mouth.     ??? ibuprofen (MOTRIN) 200 mg tablet Take  by mouth.       No current facility-administered medications on file prior to visit.          No Known Allergies     Family history is significant for family history includes Heart Disease in her father.Victoria Blonder.    [place]  reports that she has quit smoking. She has never used smokeless tobacco. She reports that she does not drink alcohol or use drugs.     She completed her Systems Review which is documented below.  I tried to  relate any problem to gyn systems and if not related she is referred to her PCP. Blood pressure 102/64, height 5\' 2"  (1.575 m), weight 135 lb (61.2 kg), last menstrual period 09/07/2018.  Body mass index is 24.69 kg/m??.     A&Ox3.  NAD  NL thought/judgment  Psych - grossly NL, not anxious  Neuro - CN 2-12 grossly intact. NL gait and movement  HEENT - NC/AT EOMi, PERRL  Abd soft, NT/ND.  No CVAT.  No hernias.  Pelvic NL EGBUS and vagina.  No CMT.  UTe NL SSC. Fullness right adnexa, no masses or tenderness.    Diagnoses and all orders for this visit:    1. Abnormal perimenopausal bleeding - pt reassured.  Do not feel KoreauS necessary at this time; however if it continues or worse would proceed.  Potential causes discussed eg perimenopausal hormonal changes, polyp, hyperplasia, Ca discussed. PMP care discussed.  Could consider HRT; also could consider OCs  or IUD but is likely very close to menopausal status. R/b/a compared. Do not feel surgical intervention necessary yet. If continues will need imaging and poss D&C or more.    2. Pelvic pain - mild, prob related to ovulation attempt    3. Interstitial cystitis - rec decrease caffeine!!    4. Urinary frequency - d/t #3  -     AMB POC URINALYSIS DIP STICK MANUAL W/O MICRO         Follow-up and Dispositions    ?? Return in about 2 months (around 12/01/2018) for Annual.         Length of visit 25 minutes  > 50% of visit spent in face to face counseling and coordination of care for the above.

## 2018-09-30 NOTE — Progress Notes (Signed)
Victoria Shaw presents with new onset irregular spotting.  Over the past year cycles have increased to about q 3 weeks.  Also has occasional very faint mid cycle spotting.  Last week however had several days of spotting and twinges of pain in the right side off and on.  Agg none allev none radiate none. No actual pain, no dyspareunia. NO dysuria/hematuria.  H/o IC, admits taking in a lot more caffeine lately. Is getting up a lot more at night to urinate.  Denies significant hot flashes or night sweats.  Exercising more but unable to lose weight  Victoria Shaw  has a past medical history of Interstitial cystitis. .    OB History   Gravida Para Term Preterm AB Living   3 2 2   1 2    SAB TAB Ectopic Molar Multiple Live Births   1                # Outcome Date GA Lbr Len/2nd Weight Sex Delivery Anes PTL Lv   3 SAB            2 Term            1 Term                  Her surgeries include  has a past surgical history that includes hx wisdom teeth extraction; hx dilation and curettage; hx cyst incision and drainage; and hx breast biopsy.[name].    Her current meds are   Current Outpatient Medications on File Prior to Visit   Medication Sig Dispense Refill   ??? Cetirizine (ZyrTEC) 10 mg cap Take  by mouth.     ??? melatonin 5 mg cap capsule Take 5 mg by mouth nightly.     ??? fluticasone (FLONASE) 50 mcg/actuation nasal spray 2 Sprays by Both Nostrils route daily.     ??? B.infantis-B.ani-B.long-B.bifi (PROBIOTIC 4X) 10-15 mg TbEC Take  by mouth.     ??? ibuprofen (MOTRIN) 200 mg tablet Take  by mouth.       No current facility-administered medications on file prior to visit.          No Known Allergies     Family history is significant for family history includes Heart Disease in her father.Victoria Blonder.    [name]  reports that she has quit smoking. She has never used smokeless tobacco. She reports that she does not drink alcohol or use drugs.     She completed her Systems Review which is documented below.  I tried to relate any problem to gyn systems and if not  related she is referred to her PCP. Blood pressure 102/64, height 5\' 2"  (1.575 m), weight 135 lb (61.2 kg), last menstrual period 09/07/2018.  Body mass index is 24.69 kg/m??.     A&Ox3.  NAD  NL thought/judgment  Psych - grossly NL, not anxious  Neuro - CN 2-12 grossly intact. NL gait and movement  HEENT - NC/AT EOMi, PERRL  Abd soft, NT/ND.  No CVAT.  No hernias.  Pelvic NL EGBUS and vagina.  No CMT.  UTe NL SSC. Fullness right adnexa, no masses or tenderness.    Diagnoses and all orders for this visit:    1. Abnormal perimenopausal bleeding - pt reassured.  Do not feel [place] necessary at this time; however if it continues or worse would proceed.  Potential causes discussed eg perimenopausal hormonal changes, polyp, hyperplasia, Ca discussed. PMP care discussed.  Could consider HRT; also could consider OCs  or IUD but is likely very close to menopausal status. R/b/a compared. Do not feel surgical intervention necessary yet. If continues will need imaging and poss D&C or more.    2. Pelvic pain - mild, prob related to ovulation attempt    3. Interstitial cystitis - rec decrease caffeine!!    4. Urinary frequency - d/t #3  -     AMB POC URINALYSIS DIP STICK MANUAL W/O MICRO         Follow-up and Dispositions    ?? Return in about 2 months (around 12/01/2018) for Annual.         Length of visit 25 minutes  > 50% of visit spent in face to face counseling and coordination of care for the above.

## 2018-11-27 ENCOUNTER — Encounter: Payer: PRIVATE HEALTH INSURANCE | Attending: Obstetrics & Gynecology | Primary: Internal Medicine

## 2018-11-28 ENCOUNTER — Inpatient Hospital Stay: Admit: 2018-11-28 | Payer: PRIVATE HEALTH INSURANCE | Attending: Obstetrics & Gynecology | Primary: Internal Medicine

## 2018-11-28 DIAGNOSIS — Z1231 Encounter for screening mammogram for malignant neoplasm of breast: Secondary | ICD-10-CM

## 2019-01-13 ENCOUNTER — Ambulatory Visit
Admit: 2019-01-13 | Discharge: 2019-01-13 | Payer: PRIVATE HEALTH INSURANCE | Attending: Obstetrics & Gynecology | Primary: Internal Medicine

## 2019-01-13 ENCOUNTER — Ambulatory Visit: Attending: Obstetrics & Gynecology | Primary: Internal Medicine

## 2019-01-13 DIAGNOSIS — Z01419 Encounter for gynecological examination (general) (routine) without abnormal findings: Secondary | ICD-10-CM

## 2019-01-13 LAB — AMB POC URINALYSIS DIP STICK MANUAL W/O MICRO

## 2019-01-13 LAB — AMB POC OCCULT, OTHER SOURCE
Fecal Occult Blood, POC: NEGATIVE
Hemoccult (POC): NEGATIVE

## 2019-01-13 LAB — AMB POC HEMOGLOBIN (HGB)
Hemoglobin (POC): 13.7
Hemoglobin, POC: 13.7 NA

## 2019-01-13 NOTE — Progress Notes (Signed)
HISTORY OF PRESENT ILLNESS  Victoria Shaw is a 48 y.o. female. , G3 P2012 , who presents today for a routine annual gynecological examination.Patient's last menstrual period was 12/21/2018 (approximate). . Cycles still coming about q 3 weeks regularly, only 2 heavy days, lasts 5-6 days.  NL cramping.  Ow Doing well.  No Gyn, Breast, G/U, or GI complaints.   Weight is creeping back up a little and is working on that.    Hgb: 13.7    U/A: wnl    Mammogram/Pap Hx 01/13/2019 11/21/2017 11/13/2016   Mammogram Date 11/28/2018 11/24/2016 11/08/2015   Mammogram Result wnl additional images needed -left breast ultrasound WNL f/u images - WNL    Pap Date 11/21/2017 11/13/2016 10/20/2015   Pap Result wnl WNL WNL       Last Colo n/a  Last time cholesterol was checked: 1 year ago    OB History:   OB History   Gravida Para Term Preterm AB Living   3 2 2   1 2    SAB TAB Ectopic Molar Multiple Live Births   1                # Outcome Date GA Lbr Len/2nd Weight Sex Delivery Anes PTL Lv   3 SAB            2 Term            1 Term                There are no preventive care reminders to display for this patient.    GYN History     Menstrual History  Menarche: 12  Period cycle: 28 - 30  Period duration in days: 5  Period pattern: Regular  Menstrual flow: Moderate  Period control: Tampon regular, Thin pad  Frequency of change for flow control: 4 times a day  Dysmenorrhea: (!) Moderate  Dysmenorrhea symptoms: Cramping, Nausea, Headache, Breast tenderness  Dyspareunia: None  Post-coital bleeding: None      Contraception: rhythm method    Victoria Shaw  has a past medical history of Interstitial cystitis. [name]    Her surgeries include  has a past surgical history that includes hx wisdom teeth extraction; hx dilation and curettage; hx cyst incision and drainage; and hx breast biopsy.[name]    No Known Allergies     Her current meds are:   Current Outpatient Medications   Medication Sig    ??? cholecalciferol (Vitamin D3) (1000 Units /25 mcg) tablet Take  by mouth daily.   ??? VITAMIN B COMPLEX PO Take  by mouth.   ??? OTHER    ??? Cetirizine (ZyrTEC) 10 mg cap Take  by mouth.   ??? melatonin 5 mg cap capsule Take 5 mg by mouth nightly.   ??? B.infantis-B.ani-B.long-B.bifi (PROBIOTIC 4X) 10-15 mg TbEC Take  by mouth.   ??? ibuprofen (MOTRIN) 200 mg tablet Take  by mouth.     No current facility-administered medications for this visit.           Family history is significant for family history includes Heart Disease in her father.Victoria Shaw  reports that she has quit smoking. She has never used smokeless tobacco. She reports that she does not drink alcohol or use drugs.     She completed her Systems Review which is documented below.  I tried to relate any problem to gyn systems and if not related she is referred to her PCP  Blood pressure 108/72, height 5\' 2"  (1.575 m), weight 138 lb (62.6 kg), last menstrual period 12/21/2018.   Body mass index is 25.24 kg/m??.        Wt Readings from Last 3 Encounters:   01/13/19 138 lb (62.6 kg)   09/30/18 135 lb (61.2 kg)   11/21/17 129 lb (58.5 kg)        HPI    Review of Systems   Constitutional: Negative.  Negative for fever, malaise/fatigue and weight loss.   Respiratory: Negative.    Cardiovascular: Negative.    Gastrointestinal: Negative.  Negative for blood in stool, constipation, diarrhea, nausea and vomiting.   Genitourinary: Negative.  Negative for dysuria, frequency, hematuria and urgency.   Musculoskeletal: Negative.  Negative for falls.   Skin: Negative.    Neurological: Negative.    Endo/Heme/Allergies: Negative.    Psychiatric/Behavioral: Negative.  Negative for depression. The patient is not nervous/anxious.    All other systems reviewed and are negative.      Physical Exam  Vitals signs reviewed. Exam conducted with a chaperone present.   Constitutional:       General: She is not in acute distress.      Appearance: Normal appearance. She is well-developed. She is not ill-appearing.   HENT:      Head: Normocephalic and atraumatic.   Neck:      Musculoskeletal: Normal range of motion.   Pulmonary:      Effort: Pulmonary effort is normal.   Chest:      Breasts: Breasts are symmetrical.         Right: Normal. No bleeding, inverted nipple, mass, nipple discharge, skin change or tenderness.         Left: Normal. No bleeding, inverted nipple, mass, nipple discharge, skin change or tenderness.   Abdominal:      Palpations: Abdomen is soft. There is no mass.      Tenderness: There is no abdominal tenderness. There is no guarding or rebound.      Hernia: There is no hernia in the left inguinal area or right inguinal area.   Genitourinary:     General: Normal vulva.      Exam position: Lithotomy position.      Labia:         Right: No rash, tenderness or lesion.         Left: No rash, tenderness or lesion.       Urethra: No prolapse.      Vagina: Normal.      Cervix: Normal.      Uterus: Normal.       Adnexa: Right adnexa normal and left adnexa normal.        Right: No mass or tenderness.          Left: No mass or tenderness.        Rectum: Normal. Guaiac result negative.   Musculoskeletal: Normal range of motion.   Skin:     General: Skin is warm and dry.   Neurological:      Mental Status: She is alert and oriented to person, place, and time.   Psychiatric:         Behavior: Behavior normal.         Thought Content: Thought content normal.         Judgment: Judgment normal.         ASSESSMENT and PLAN  Diagnoses and all orders for this visit:    1. Well woman exam -  perimenopausal bleeding stable  -     MAM 3D TOMO W MAMMO BI SCREENING INCL CAD; Future    2. Screening for cervical cancer  -     PAP, IG, RFX HPV ASCUS ([id number])    3. Screening for iron deficiency anemia  -     AMB POC HEMOGLOBIN (HGB)    4. Screening for hematuria or proteinuria  -     AMB POC URINALYSIS DIP STICK MANUAL W/O MICRO     5. Screening for rectal cancer - cologuard / colonoscopy discussed  -     AMB POC OCCULT, OTHER SOURCE    Reviewed diet/exercise    Follow-up and Dispositions    ?? Return in about 1 year (around 01/13/2020) for Annual.

## 2019-01-13 NOTE — Progress Notes (Signed)
HISTORY OF PRESENT ILLNESS  Victoria Shaw is a 48 y.o. female. , G3 P2012 , who presents today for a routine annual gynecological examination.Patient's last menstrual period was 12/21/2018 (approximate). . Cycles still coming about q 3 weeks regularly, only 2 heavy days, lasts 5-6 days.  NL cramping.  Ow Doing well.  No Gyn, Breast, G/U, or GI complaints.   Weight is creeping back up a little and is working on that.    Hgb: 13.7    U/A: wnl    Mammogram/Pap Hx 01/13/2019 11/21/2017 11/13/2016   Mammogram Date 11/28/2018 11/24/2016 11/08/2015   Mammogram Result wnl additional images needed -left breast ultrasound WNL f/u images - WNL    Pap Date 11/21/2017 11/13/2016 10/20/2015   Pap Result wnl WNL WNL       Last Colo n/a  Last time cholesterol was checked: 1 year ago    OB History:   OB History   Gravida Para Term Preterm AB Living   3 2 2   1 2    SAB TAB Ectopic Molar Multiple Live Births   1                # Outcome Date GA Lbr Len/2nd Weight Sex Delivery Anes PTL Lv   3 SAB            2 Term            1 Term                There are no preventive care reminders to display for this patient.    GYN History     Menstrual History  Menarche: 12  Period cycle: 28 - 30  Period duration in days: 5  Period pattern: Regular  Menstrual flow: Moderate  Period control: Tampon regular, Thin pad  Frequency of change for flow control: 4 times a day  Dysmenorrhea: (!) Moderate  Dysmenorrhea symptoms: Cramping, Nausea, Headache, Breast tenderness  Dyspareunia: None  Post-coital bleeding: None      Contraception: rhythm method    Christian  has a past medical history of Interstitial cystitis. [name]    Her surgeries include  has a past surgical history that includes hx wisdom teeth extraction; hx dilation and curettage; hx cyst incision and drainage; and hx breast biopsy.[name]    No Known Allergies     Her current meds are:   Current Outpatient Medications   Medication Sig   ??? cholecalciferol (Vitamin D3) (1000 Units /25 mcg) tablet Take  by mouth  daily.   ??? VITAMIN B COMPLEX PO Take  by mouth.   ??? OTHER    ??? Cetirizine (ZyrTEC) 10 mg cap Take  by mouth.   ??? melatonin 5 mg cap capsule Take 5 mg by mouth nightly.   ??? B.infantis-B.ani-B.long-B.bifi (PROBIOTIC 4X) 10-15 mg TbEC Take  by mouth.   ??? ibuprofen (MOTRIN) 200 mg tablet Take  by mouth.     No current facility-administered medications for this visit.           Family history is significant for family history includes Heart Disease in her father.[name]  reports that she has quit smoking. She has never used smokeless tobacco. She reports that she does not drink alcohol or use drugs.     She completed her Systems Review which is documented below.  I tried to relate any problem to gyn systems and if not related she is referred to her PCP  Blood pressure 108/72, height 5\' 2"  (1.575 m), weight 138 lb (62.6 kg), last menstrual period 12/21/2018.   Body mass index is 25.24 kg/m??.        Wt Readings from Last 3 Encounters:   01/13/19 138 lb (62.6 kg)   09/30/18 135 lb (61.2 kg)   11/21/17 129 lb (58.5 kg)        HPI    Review of Systems   Constitutional: Negative.  Negative for fever, malaise/fatigue and weight loss.   Respiratory: Negative.    Cardiovascular: Negative.    Gastrointestinal: Negative.  Negative for blood in stool, constipation, diarrhea, nausea and vomiting.   Genitourinary: Negative.  Negative for dysuria, frequency, hematuria and urgency.   Musculoskeletal: Negative.  Negative for falls.   Skin: Negative.    Neurological: Negative.    Endo/Heme/Allergies: Negative.    Psychiatric/Behavioral: Negative.  Negative for depression. The patient is not nervous/anxious.    All other systems reviewed and are negative.      Physical Exam  Vitals signs reviewed. Exam conducted with a chaperone present.   Constitutional:       General: She is not in acute distress.     Appearance: Normal appearance. She is well-developed. She is not ill-appearing.   HENT:      Head: Normocephalic and atraumatic.    Neck:      Musculoskeletal: Normal range of motion.   Pulmonary:      Effort: Pulmonary effort is normal.   Chest:      Breasts: Breasts are symmetrical.         Right: Normal. No bleeding, inverted nipple, mass, nipple discharge, skin change or tenderness.         Left: Normal. No bleeding, inverted nipple, mass, nipple discharge, skin change or tenderness.   Abdominal:      Palpations: Abdomen is soft. There is no mass.      Tenderness: There is no abdominal tenderness. There is no guarding or rebound.      Hernia: There is no hernia in the left inguinal area or right inguinal area.   Genitourinary:     General: Normal vulva.      Exam position: Lithotomy position.      Labia:         Right: No rash, tenderness or lesion.         Left: No rash, tenderness or lesion.       Urethra: No prolapse.      Vagina: Normal.      Cervix: Normal.      Uterus: Normal.       Adnexa: Right adnexa normal and left adnexa normal.        Right: No mass or tenderness.          Left: No mass or tenderness.        Rectum: Normal. Guaiac result negative.   Musculoskeletal: Normal range of motion.   Skin:     General: Skin is warm and dry.   Neurological:      Mental Status: She is alert and oriented to person, place, and time.   Psychiatric:         Behavior: Behavior normal.         Thought Content: Thought content normal.         Judgment: Judgment normal.         ASSESSMENT and PLAN  Diagnoses and all orders for this visit:    1. Well woman exam -  perimenopausal bleeding stable  -     MAM 3D TOMO W MAMMO BI SCREENING INCL CAD; Future    2. Screening for cervical cancer  -     PAP, IG, RFX HPV ASCUS ([id number])    3. Screening for iron deficiency anemia  -     AMB POC HEMOGLOBIN (HGB)    4. Screening for hematuria or proteinuria  -     AMB POC URINALYSIS DIP STICK MANUAL W/O MICRO    5. Screening for rectal cancer - cologuard / colonoscopy discussed  -     AMB POC OCCULT, OTHER SOURCE    Reviewed diet/exercise    Follow-up and  Dispositions    ?? Return in about 1 year (around 01/13/2020) for Annual.

## 2019-01-22 LAB — PAP, IG, RFX HPV ASCUS (507301)
.: 0
LABCORP 019018: 0

## 2019-03-24 NOTE — Telephone Encounter (Signed)
TC from pt c/o continued irregular bleeding.  Per Dr Salome Holmes last note, if pt continues to have symptoms will need an [place] and evaluation.  Attempted to call pt back to notify, no answer.  LM for pt to RTC.

## 2019-04-03 ENCOUNTER — Ambulatory Visit
Admit: 2019-04-03 | Discharge: 2019-04-03 | Payer: PRIVATE HEALTH INSURANCE | Attending: Obstetrics & Gynecology | Primary: Internal Medicine

## 2019-04-03 ENCOUNTER — Ambulatory Visit: Attending: Obstetrics & Gynecology | Primary: Internal Medicine

## 2019-04-03 DIAGNOSIS — N924 Excessive bleeding in the premenopausal period: Secondary | ICD-10-CM

## 2019-04-03 NOTE — Progress Notes (Signed)
Back with continued and worsening irregular bleeding.  Has bled for about 3 weeks this month, on and off light v heavy and pass clots. Lots of cramping. This all started back 5-6 months ago and got better for a while but worse lately.  Last pap was completely benign a few months ago.    No changes to PMH/PSH.    US obtained and images reviewed.  Ute NL size.There are two small type-5 fibroids.  However the ES is thickened at 10.3mm. Small benign appearing (2.4cm) left ov cyst.    Diagnoses and all orders for this visit:    1. Abnormal perimenopausal bleeding  -     AMB POC US, TRANSVAGINAL  -     SURGICAL PATHOLOGY  -     BIOPSY OF UTERUS LINING    2. Dysmenorrhea  -     AMB POC US, TRANSVAGINAL    3. Thickened endometrium - findings reviewed.  High suspicion for endometrial polyp, however could be benign perimenopausal bleeding, but also need to r/o hyperplasia, and even Ca although this unlikely.  EMB today with management discussions of each discussed, vs skipping and going straight to D&C discussed.      ENDOMETRIAL BIOPSY    Date:  02/09/1969    Name:  Victoria Shaw     DOB:  11/12/1970    Age:  48 y.o.    Reason for procedure:  abnl bleeding and endometrial thickening.        Speculum was inserted.  Cervix visualized and cleansed with betadine.  Single-tooth tenaculum was applied to anterior lip.  Curette was inserted through OS.  Uterus sounded to 7 cm.  Tissue obtained was small and fragmented in amount.  Patient tolerated procedure well.        Victoria Lansdowne P Pearley Millington, MD    Will contact with results, and plan as needed.  If benign rx progesterone tx, ow would likely need D&C.  Procedure reviewed.

## 2019-04-03 NOTE — Progress Notes (Signed)
Back with continued and worsening irregular bleeding.  Has bled for about 3 weeks this month, on and off light v heavy and pass clots. Lots of cramping. This all started back 5-6 months ago and got better for a while but worse lately.  Last pap was completely benign a few months ago.    No changes to PMH/PSH.    US obtained and images reviewed.  Ute NL size.There are two small type-5 fibroids.  However the ES is thickened at 10.67mm. Small benign appearing (2.4cm) left ov cyst.    Diagnoses and all orders for this visit:    1. Abnormal perimenopausal bleeding  -     AMB POC [place], TRANSVAGINAL  -     SURGICAL PATHOLOGY  -     BIOPSY OF UTERUS LINING    2. Dysmenorrhea  -     AMB POC [place], TRANSVAGINAL    3. Thickened endometrium - findings reviewed.  High suspicion for endometrial polyp, however could be benign perimenopausal bleeding, but also need to r/o hyperplasia, and even Ca although this unlikely.  EMB today with management discussions of each discussed, vs skipping and going straight to D&C discussed.      ENDOMETRIAL BIOPSY    Date:  03/20/68    Name:  Victoria Shaw     DOB:  Aug 22, 1970    Age:  49 y.o.    Reason for procedure:  abnl bleeding and endometrial thickening.        Speculum was inserted.  Cervix visualized and cleansed with betadine.  Single-tooth tenaculum was applied to anterior lip.  Curette was inserted through OS.  Uterus sounded to 7 cm.  Tissue obtained was small and fragmented in amount.  Patient tolerated procedure well.        Tomasa Hosteller, MD    Will contact with results, and plan as needed.  If benign rx progesterone tx, ow would likely need D&C.  Procedure reviewed.

## 2019-04-09 LAB — SURGICAL PATHOLOGY
PDF IMAGE, 807507: 0
PDF Image: 0

## 2019-04-10 MED ORDER — NORETHINDRONE ACETATE 5 MG TAB
5 mg | ORAL_TABLET | Freq: Every day | ORAL | 0 refills | Status: AC
Start: 2019-04-10 — End: 2019-05-10

## 2019-04-10 NOTE — Telephone Encounter (Signed)
Received call from patient for results.  Message sent to Dr [facility] RN Charlotte Sanes to call patient with results.

## 2019-04-28 MED ORDER — NORETHINDRONE-ETHINYL ESTRADIOL-IRON 1 MG-20 MCG (24)/75 MG (4) TAB
1 mg-20 mcg (24)/75 mg (4) | PACK | Freq: Every day | ORAL | 0 refills | Status: DC
Start: 2019-04-28 — End: 2019-05-19

## 2019-04-28 NOTE — Telephone Encounter (Signed)
Victoria Hosteller, MD  Milus Glazier, RN      ??      Let's change to a low dose OCP for a monh and see if it takes care of the bleeding. ??Rec LoEstrin 1-20.   If not then we may have to proceed with the D&C after all.     EH    Previous Messages    ----- Message -----   From: Milus Glazier, RN   Sent: 04/27/2019 ??11:17 AM EST   To: Victoria Hosteller, MD   Subject: Medication ?? ?? ?? ?? ?? ?? ?? ?? ?? ?? ?? ?? ?? ?? ?? ?? ??     TC from pt stating she started the aygestin about 2 weeks ago. ??Pt c/o constant spotting since starting the medication. ??     Is there anything else you suggest for her? ??     Thanks in advance.         LM for pt to RTC to review.

## 2019-05-19 MED ORDER — NORETHINDRONE-ETHINYL ESTRADIOL-IRON 1 MG-20 MCG (24)/75 MG (4) TAB
1 mg-20 mcg (24)/75 mg (4) | PACK | Freq: Every day | ORAL | 5 refills | Status: DC
Start: 2019-05-19 — End: 2019-11-15

## 2019-05-19 NOTE — Telephone Encounter (Signed)
TC from pt needing a refill on her OCP - states her bleeding is well controlled on the pill.  Sent to pharmacy on file.

## 2019-11-16 MED ORDER — NORETHINDRONE-ETHINYL ESTRADIOL-IRON 1 MG-20 MCG (24)/75 MG (4) TAB
1 mg-20 mcg (24)/75 mg (4) | Freq: Every day | ORAL | 2 refills | Status: DC
Start: 2019-11-16 — End: 2020-02-03

## 2019-11-24 NOTE — Telephone Encounter (Signed)
TC from pt c/o spotting after missing an OCP.  Pt notified this is normal.  To continue medication as prescribed, call if symptoms worsen.

## 2019-12-01 ENCOUNTER — Inpatient Hospital Stay: Admit: 2019-12-01 | Payer: PRIVATE HEALTH INSURANCE | Attending: Obstetrics & Gynecology | Primary: Internal Medicine

## 2019-12-01 DIAGNOSIS — Z01419 Encounter for gynecological examination (general) (routine) without abnormal findings: Secondary | ICD-10-CM

## 2019-12-03 NOTE — Telephone Encounter (Signed)
Returned patient's call.  Victoria Shaw has concerns of  Starting to spot before reaching the "period" pills of her OCP's.  She states she is in her 3rd week of pills.  I advised her to continue them and I would let SD know that she called.

## 2019-12-14 ENCOUNTER — Encounter: Payer: PRIVATE HEALTH INSURANCE | Attending: Obstetrics & Gynecology | Primary: Internal Medicine

## 2019-12-16 NOTE — Telephone Encounter (Signed)
TC from pt c/o continued intermittent spotting on her OCP.  Pt needs appt to discuss this with Dr Sheran Spine.  Message sent to [facility] and Belenda Cruise to find a work in appointment spot.

## 2019-12-23 ENCOUNTER — Ambulatory Visit
Admit: 2019-12-23 | Discharge: 2019-12-23 | Payer: PRIVATE HEALTH INSURANCE | Attending: Obstetrics & Gynecology | Primary: Internal Medicine

## 2019-12-23 ENCOUNTER — Ambulatory Visit: Attending: Obstetrics & Gynecology | Primary: Internal Medicine

## 2019-12-23 DIAGNOSIS — N926 Irregular menstruation, unspecified: Secondary | ICD-10-CM

## 2019-12-23 LAB — AMB POC URINE PREGNANCY TEST, VISUAL COLOR COMPARISON
HCG urine, Ql. (POC): NEGATIVE
HCG, Pregnancy, Urine, POC: NEGATIVE

## 2019-12-23 NOTE — Progress Notes (Signed)
Back for continued irregular bleeding / spotting / bloating / cramping despite OCs.  Did well at first but the past 2-3 months sx have exacerbated and are worsening again.  Not heavy just constant.  Some cramping.  Also feels dizzy and hungry all the time and gaining weight since starting her OCs, despite regular strenuous exercise.    Minimal PMP sx.  Her sister is two years older and also has not reached menopause.    She is ready for definitive therapy.    UCG negative  Blood pressure 120/80, height 5\' 2"  (1.575 m), weight 142 lb (64.4 kg), last menstrual period 12/07/2019.  Body mass index is 25.97 kg/m??.     Wt Readings from Last 3 Encounters:   12/23/19 142 lb (64.4 kg)   04/03/19 139 lb (63 kg)   01/13/19 138 lb (62.6 kg)      A&Ox3.  NAD  NL thought/judgment  Psych - grossly NL, not anxious  Neuro - CN 2-12 grossly intact. NL gait and movement  HEENT - NC/AT EOMi, PERRL  Neck - supple, no thyromegaly   Abd soft  Pelvic NL EGBUS and vagina, Ut still mildly enlarged and tender.    Diagnoses and all orders for this visit:    1. Irregular menses - failed therapy.    2. Abnormal perimenopausal bleeding  -     AMB POC URINE PREGNANCY TEST, VISUAL COLOR COMPARISON    Options reviewed would like to proceed with laparoscopic hyst.  Procedure and recovery discussed at length.  Will proceed.

## 2020-01-13 NOTE — Interval H&P Note (Signed)
PLEASE CONTINUE TAKING ALL PRESCRIPTION MEDICATIONS UP TO THE DAY OF SURGERY UNLESS OTHERWISE DIRECTED BELOW.    DISCONTINUE all vitamins, herbals and supplements 7 days prior to surgery. DISCONTINUE Non-Steriodal Anti-Inflammatory (NSAIDS) such as Advil, Ibuprofen, and Aleve 5 days prior to surgery.     Home Medications to HOLD      All vitamins, supplements, and herbals stop 7 days prior to surgery   All NSAIDs such as Advil, Aleve, Ibuprofen, Diclofenac, Naproxen, etc. Stop 5 days prior to surgery.    Aspirin and Aspirin products stop 5 days prior to surgery.   *IT IS OK TO TAKE TYLENOL*     Home Medications to take  the day of surgery   Lo-estrin FE        Comments   *The day before surgery, 02/02/20, take Acetaminophen (Tylenol) 1000mg  in the morning and again at bedtime*             Please do not bring home medications with you on the day of surgery unless otherwise directed by your nurse.  If you are instructed to bring home medications, please give them to your nurse as they will be administered by the nursing staff.    If you have any questions, please call [address] [place] [phone] or [address] [place] [phone].    Copy of above instructions will be given and reviewed with patient at GYN/ERAS class.

## 2020-01-13 NOTE — Interval H&P Note (Signed)
Patient verified name and DOB    Order for consent NOT found in EHR; patient verified.     Type 2 surgery    Labs per surgeon: No orders received.  Labs per anesthesia protocol: Hgb, T&S DOS; orders signed and held in EHR.   EKG: None per anesthesia protocol.     A negative Covid swab result is required to proceed with surgery; The testing center is located at the [facility] [address], [place]. An appointment is required therefore the patient will be contacted by the Covid swab team. The testing clinic is closed from 12-1 for lunch and on weekends. For questions or concerns the patient should call [phone].  Appointment date/time 02/01/20 at 0915 found in EHR and provided to patient.    Patient informed of GYN class on 01/20/20 at which time labs will be drawn. Patient will also receive all patient education and hospital approved surgical skin cleanser to use per hospital policy.    Patient instructed to hold all vitamins, supplements, herbals 7 days prior to surgery and NSAIDS/ASA 5 days prior to surgery, patient verbalized understanding.    Patient instructed to continue previous medications as prescribed prior to surgery and to take the following medications the day of surgery according to anesthesia guidelines with a small sip of water: Lo-estrin.     Patient answered medical/surgical history questions at their best of ability. All prior to admission medications documented in [facility].

## 2020-01-13 NOTE — Interval H&P Note (Signed)
Periop Notes by Michaelyn Barter, RN at 01/13/20 0930                Author: Michaelyn Barter, RN  Service: NURSING  Author Type: Registered Nurse       Filed: 01/18/20 1323  Date of Service: 01/13/20 0930  Status: Signed          Editor: Michaelyn Barter, RN (Registered Nurse)                        Enhanced Recovery After GYN Surgery: non-diabetic patients      It is highly recommended you purchase and drink Ensure Enlive - one bottle twice daily for five days: 01/28/20-02/01/20.  Ensure Enlive is the preferred formula over other Ensure formulas as it is the only one  that contains CaHMB which helps maintain and rebuild muscle health. It is recommended that you continue drinking this for one month after surgery.      The night before surgery 02/02/20, drink 2 bottles of the Ensure Pre-Surgery drink.       The morning of surgery 02/03/20, drink one bottle of the Ensure Pre-Surgery drink while on  your way to the hospital. Drink this over 5-10  minutes.      Drink nothing else after drinking the pre-surgical drink the morning of surgery.      Bring your patient handbook with you to the hospital.      Things to remember:      1. You will be given clear liquids to drink, advancing diet as tolerated      2. You will be up and moving around with assistance 2-4 hours after surgery.      3. You will be given regularly scheduled pain medications (NSAIDS, Tylenol, Gabapentin) with narcotics as needed.      4. You may be able to go home that night if the surgeon okays and you are up and eating and drinking. Otherwise, your discharge will be the following morning around lunch time.       5. Continue drinking Ensure Enlive for 5 days after surgery.       Copy of above instructions will be given and reviewed with patient at GYN/ERAS class.

## 2020-01-13 NOTE — Interval H&P Note (Signed)
Hgb 14.8 within anesthesia guidelines, no follow-up required.

## 2020-01-14 ENCOUNTER — Inpatient Hospital Stay: Primary: Internal Medicine

## 2020-01-19 ENCOUNTER — Ambulatory Visit
Admit: 2020-01-19 | Discharge: 2020-01-19 | Payer: PRIVATE HEALTH INSURANCE | Attending: Obstetrics & Gynecology | Primary: Internal Medicine

## 2020-01-19 ENCOUNTER — Ambulatory Visit: Attending: Obstetrics & Gynecology | Primary: Internal Medicine

## 2020-01-19 DIAGNOSIS — Z01419 Encounter for gynecological examination (general) (routine) without abnormal findings: Secondary | ICD-10-CM

## 2020-01-19 NOTE — Progress Notes (Signed)
Victoria Shaw is 49 y.o. female, G3 P2012 , who presents today for a routine annual gynecological examination.Patient's last menstrual period was 01/10/2020 (approximate). .   Scheduled for Hyst in 2 wks.  Has a h/o continued irregular bleeding / spotting / bloating / cramping despite OCs.  Did well at first but the past 2-3 months sx have exacerbated and are worsening again.  Not heavy just constant.  Some cramping  Today reports Has felt pain and a possible lump in her right breast.. No other new c/o.    Mammogram/Pap Hx 01/19/2020 01/13/2019 11/21/2017 11/13/2016   Mammogram Date 12/01/2019 11/28/2018 11/24/2016 11/08/2015   Mammogram Result wnl wnl additional images needed -left breast ultrasound WNL f/u images - WNL    Pap Date 01/13/2019 11/21/2017 11/13/2016 10/20/2015   Pap Result wnl wnl WNL WNL   ]    ??? Contraception: OCP (Oral Contraceptive Pills)  ??? Has received Gardisil: NO  ??? Last Colo n/a, scheduled for February 2022  ??? Last time cholesterol was checked: ~1 year ago    OB History:   OB History   Gravida Para Term Preterm AB Living   3 2 2   1 2    SAB IAB Ectopic Molar Multiple Live Births   1                # Outcome Date GA Lbr Len/2nd Weight Sex Delivery Anes PTL Lv   3 SAB            2 Term            1 Term                  Health Maintenance Due   Topic Date Due   ??? Hepatitis C Test  Never done       GYN History     Menstrual History  Menarche: 12  Period cycle: Irregular  Period duration in days: other  Period pattern: (!) Irregular  Menstrual flow: Light   Period control: Tampon regular, Maxi pad  Frequency of change for flow control: 4 times a day  Dysmenorrhea: (!) Mild  Dysmenorrhea symptoms: Cramping  Dyspareunia: (!) Superficial  Post-coital bleeding: None      Victoria Shaw  has a past medical history of Allergic rhinitis and Interstitial cystitis. Victoria Shaw    Her surgeries include  has a past surgical history that includes hx wisdom teeth extraction; hx dilation and curettage (2002); hx cyst incision and  drainage; and hx breast biopsy..    Allergies   Allergen Reactions   ??? Norco [Hydrocodone-Acetaminophen] Other (comments)     Hallucinations         Her current meds are:   Current Outpatient Medications   Medication Sig   ??? cholecalciferol, vitamin D3, (VITAMIN D3 PO) Take  by mouth.   ??? ibuprofen (MOTRIN) 200 mg tablet Take 800 mg by mouth.   ??? norethindrone-e estradiol-iron (LOESTRIN FE) 1 mg-20 mcg (24)/75 mg (4) tab Take 1 Tablet by mouth daily.   ??? elderberry fruit (ELDERBERRY PO) Take  by mouth.   ??? VITAMIN B COMPLEX PO Take  by mouth.   ??? OTHER    ??? Cetirizine (ZyrTEC) 10 mg cap Take  by mouth.   ??? melatonin 5 mg cap capsule Take 5 mg by mouth nightly.     No current facility-administered medications for this visit.          Family history is significant for family history includes Heart  Disease in her father.Victoria Shaw  reports that she has quit smoking. She has never used smokeless tobacco. She reports that she does not drink alcohol and does not use drugs.     She completed her Systems Review which is documented below.  Any positive systems not related to Gyn are recommended to discuss with her PCP.    Review of Systems:    Constitutional: Negative.  Negative for fatigue.   Skin: Negative.    HENT: Negative.    Eyes: Negative.    Respiratory: Negative.  Negative for shortness of breath.    Breast: Negative.  Cardiovascular:  Negative. Negative for chest pain.    Gastrointestinal: Negative for abdominal pain, anal bleeding, constipation, diarrhea, nausea and vomiting.   Hematologic: Negative.  Genitourinary: Positive for menstrual problem. Negative for dysuria, pelvic pain, vaginal bleeding and vaginal discharge.   Musculoskeletal: Negative.    Neurological: Negative.    Psychiatric/Behavioral: Negative.    Endocrine: Negative.    Female Endocrine: Negative.         Blood pressure (!) 140/70, height 5' 1.5" (1.562 m), weight 137 lb (62.1 kg), last menstrual period 01/10/2020.   Body mass index is 25.47  kg/m??.   Wt Readings from Last 3 Encounters:   01/19/20 137 lb (62.1 kg)   01/18/20 138 lb (62.6 kg)   12/23/19 142 lb (64.4 kg)      Physical Exam  Vitals reviewed. Exam conducted with a chaperone present.   Constitutional:       Appearance: She is well-developed.   HENT:      Head: Normocephalic.   Cardiovascular:      Rate and Rhythm: Normal rate and regular rhythm.      Pulses: Normal pulses.   Pulmonary:      Effort: Pulmonary effort is normal.      Breath sounds: Normal breath sounds. No wheezing.   Chest:   Breasts: Breasts are symmetrical.      Right: Mass (12:00, 3cm and tender) present. No inverted nipple, nipple discharge, skin change, tenderness or axillary adenopathy.      Left: Normal. No inverted nipple, mass, nipple discharge, skin change, tenderness or axillary adenopathy.       Abdominal:      Palpations: Abdomen is soft. There is no mass.      Tenderness: There is no abdominal tenderness. There is no guarding or rebound.   Genitourinary:     General: Normal vulva.      Exam position: Lithotomy position.      Labia:         Right: No rash or tenderness.         Left: No rash or tenderness.       Vagina: Normal.      Cervix: Normal.      Uterus: Normal. Not enlarged and not tender.       Adnexa: Right adnexa normal and left adnexa normal.        Right: No mass or tenderness.          Left: No mass or tenderness.        Rectum: Normal. Guaiac result negative.   Musculoskeletal:         General: Normal range of motion.      Cervical back: Normal range of motion.   Lymphadenopathy:      Upper Body:      Right upper body: No axillary adenopathy.      Left upper  body: No axillary adenopathy.   Skin:     General: Skin is warm and dry.   Neurological:      Mental Status: She is alert and oriented to person, place, and time.   Psychiatric:         Attention and Perception: Attention normal.         Mood and Affect: Mood and affect normal.         Speech: Speech normal.         Behavior: Behavior normal.          Thought Content: Thought content normal.         Judgment: Judgment normal.          Diagnoses and all orders for this visit:    1. Well woman exam  -     PAP IG, RFX APTIMA HPV ASCUS ([id number])    2. Other screening mammogram  -     MAM 3D TOMO W MAMMO BI SCREENING INCL CAD; Future    3. Screening for cervical cancer  -     PAP IG, RFX APTIMA HPV ASCUS ([id number])    4. Screening for rectal cancer    5. Irregular menses    6. Breast mass, right  -     US BREAST RT LIMITED=<3 QUAD; Future  -     MAM MAMMO RT DX INCL CAD; Future    Preop Visit    Ms. Victoria Shaw presents for a preop visit.  She is scheduled for a Total Laparoscopic Hysterectomy, Bilateral Salpingectomy and Robotic. [name]  Her history, meds, and allergies were reviewed.  The procedure was reviewed in detail as well as the risks of bleeding, infection, DVT and potential surgical complications involving the bladder, ureters, colon or intestines.  Also the alternatives,  benefits, recovery and follow-up. Prevention of SSI discussed as indicated.  All of her questions were answered.    Tomasa Hosteller, MD  January 20, 2020        Follow-up and Dispositions    ?? Return in about 1 year (around 01/18/2021) for Annual.

## 2020-01-20 ENCOUNTER — Inpatient Hospital Stay: Admit: 2020-01-20 | Payer: PRIVATE HEALTH INSURANCE | Attending: Obstetrics & Gynecology | Primary: Internal Medicine

## 2020-01-20 DIAGNOSIS — Z01812 Encounter for preprocedural laboratory examination: Secondary | ICD-10-CM

## 2020-01-20 LAB — HEMOGLOBIN
HGB: 14.8 g/dL (ref 11.7–15.4)
Hemoglobin: 14.8 g/dL (ref 11.7–15.4)

## 2020-01-20 NOTE — Progress Notes (Signed)
Patient attended ERAS/ GYN surgery orientation class today.  Detailed instruction book regarding GYN surgery was provided at start of class. Class content included pre-operative instructions for surgery in the week prior to and day before surgery. Packet including Hibiclens and printed instructions on bathing was provided to patient. Detailed and printed diet instruction and presurgical drinks were also given to patient  in accordance with ERAS protocol. Detailed information was given regarding arriving at the hospital and instructions for the patient's day of surgery.  Discussed recovery from surgery, hospital stay, pain management, and discharge.  Reviewed recovery at home including pelvic rest, driving and activity restrictions, issues requiring call to physician etc. Answered all questions in detail.  Patient voices understanding of all.

## 2020-01-20 NOTE — Telephone Encounter (Signed)
Pt called with questions regarding dx mammogram and breast ultrasound. Pt was unsure if insurance would cover, informed pt to call [facility] for more detailed information. Pt voiced understanding.

## 2020-01-21 ENCOUNTER — Encounter

## 2020-01-21 LAB — PAP IG, RFX APTIMA HPV ASCUS (507800)
.: 0
LABCORP 019018: 0

## 2020-01-25 ENCOUNTER — Encounter

## 2020-01-27 ENCOUNTER — Inpatient Hospital Stay: Admit: 2020-01-27 | Payer: PRIVATE HEALTH INSURANCE | Attending: Obstetrics & Gynecology | Primary: Internal Medicine

## 2020-01-27 DIAGNOSIS — N6310 Unspecified lump in the right breast, unspecified quadrant: Secondary | ICD-10-CM

## 2020-02-01 ENCOUNTER — Institutional Professional Consult (permissible substitution): Payer: PRIVATE HEALTH INSURANCE | Primary: Internal Medicine

## 2020-02-01 DIAGNOSIS — Z20822 Contact with and (suspected) exposure to covid-19: Secondary | ICD-10-CM

## 2020-02-01 NOTE — Progress Notes (Signed)
Patient presented to the [facility] for COVID testing as ordered.  After verbal consent given by patient/caregiver,  nasal swab obtained and sent to lab for processing.

## 2020-02-02 MED ORDER — GABAPENTIN 300 MG CAP
300 mg | ORAL_CAPSULE | Freq: Three times a day (TID) | ORAL | 0 refills | Status: AC
Start: 2020-02-02 — End: 2020-02-07

## 2020-02-02 MED ORDER — IBUPROFEN 800 MG TAB
800 mg | ORAL_TABLET | Freq: Three times a day (TID) | ORAL | 0 refills | Status: AC | PRN
Start: 2020-02-02 — End: ?

## 2020-02-03 ENCOUNTER — Inpatient Hospital Stay: Payer: PRIVATE HEALTH INSURANCE

## 2020-02-03 LAB — TYPE AND SCREEN
ABO/Rh: O POS
Antibody Screen: NEGATIVE

## 2020-02-03 LAB — COVID-19: SARS-CoV-2, NAA: NOT DETECTED

## 2020-02-03 LAB — HCG URINE, QL. - POC
HCG, Pregnancy, Urine, POC: NEGATIVE
Pregnancy test,urine (POC): NEGATIVE

## 2020-02-03 LAB — INPATIENT

## 2020-02-03 LAB — SARS-COV-2, NAA 2 DAY TAT

## 2020-02-03 LAB — NOVEL CORONAVIRUS (COVID-19): SARS-CoV-2, NAA: NOT DETECTED

## 2020-02-03 LAB — TYPE & SCREEN
ABO/Rh(D): O POS
Antibody screen: NEGATIVE

## 2020-02-03 MED ORDER — SCOPOLAMINE (1.3-1.5) MG 72 HR TRANSDERM PATCH
1 mg over 3 days | Freq: Once | TRANSDERMAL | Status: DC
Start: 2020-02-03 — End: 2020-02-03

## 2020-02-03 MED ORDER — DEXAMETHASONE SODIUM PHOSPHATE 4 MG/ML IJ SOLN
4 mg/mL | INTRAMUSCULAR | Status: DC | PRN
Start: 2020-02-03 — End: 2020-02-03
  Administered 2020-02-03: 15:00:00 via INTRAVENOUS

## 2020-02-03 MED ORDER — BUPIVACAINE (PF) 0.5 % (5 MG/ML) IJ SOLN
0.5 % (5 mg/mL) | INTRAMUSCULAR | Status: DC | PRN
Start: 2020-02-03 — End: 2020-02-03
  Administered 2020-02-03: 15:00:00 via SUBCUTANEOUS

## 2020-02-03 MED ORDER — KETOROLAC TROMETHAMINE 30 MG/ML INJECTION
30 mg/mL (1 mL) | INTRAMUSCULAR | Status: DC | PRN
Start: 2020-02-03 — End: 2020-02-03
  Administered 2020-02-03: 15:00:00 via INTRAVENOUS

## 2020-02-03 MED ORDER — OXYCODONE 5 MG TAB
5 mg | ORAL_TABLET | Freq: Four times a day (QID) | ORAL | 0 refills | Status: AC | PRN
Start: 2020-02-03 — End: 2020-02-06

## 2020-02-03 MED ORDER — FENTANYL CITRATE (PF) 50 MCG/ML IJ SOLN
50 mcg/mL | INTRAMUSCULAR | Status: AC
Start: 2020-02-03 — End: ?

## 2020-02-03 MED ORDER — BUPIVACAINE (PF) 0.5 % (5 MG/ML) IJ SOLN
0.5 % (5 mg/mL) | INTRAMUSCULAR | Status: AC
Start: 2020-02-03 — End: ?

## 2020-02-03 MED ORDER — KETAMINE 10 MG/ML IJ SOLN
10 mg/mL | INTRAMUSCULAR | Status: AC
Start: 2020-02-03 — End: ?

## 2020-02-03 MED ORDER — NEOSTIGMINE METHYLSULFATE 3 MG/3 ML (1 MG/ML) IV SYRINGE
3 mg/ mL (1 mg/mL) | INTRAVENOUS | Status: DC | PRN
Start: 2020-02-03 — End: 2020-02-03
  Administered 2020-02-03: 15:00:00 via INTRAVENOUS

## 2020-02-03 MED ORDER — ONDANSETRON (PF) 4 MG/2 ML INJECTION
4 mg/2 mL | INTRAMUSCULAR | Status: DC | PRN
Start: 2020-02-03 — End: 2020-02-03
  Administered 2020-02-03: 15:00:00 via INTRAVENOUS

## 2020-02-03 MED ORDER — LIDOCAINE HCL 1 % (10 MG/ML) IJ SOLN
10 mg/mL (1 %) | INTRAMUSCULAR | Status: DC | PRN
Start: 2020-02-03 — End: 2020-02-03

## 2020-02-03 MED ORDER — GLYCOPYRROLATE 0.2 MG/ML IJ SOLN
0.2 mg/mL | INTRAMUSCULAR | Status: DC | PRN
Start: 2020-02-03 — End: 2020-02-03
  Administered 2020-02-03: 15:00:00 via INTRAVENOUS

## 2020-02-03 MED ORDER — PROPOFOL 10 MG/ML IV EMUL
10 mg/mL | INTRAVENOUS | Status: DC | PRN
Start: 2020-02-03 — End: 2020-02-03
  Administered 2020-02-03: 13:00:00 via INTRAVENOUS

## 2020-02-03 MED ORDER — CELECOXIB 100 MG CAP
100 mg | Freq: Two times a day (BID) | ORAL | Status: DC
Start: 2020-02-03 — End: 2020-02-03

## 2020-02-03 MED ORDER — LACTATED RINGERS IV
INTRAVENOUS | Status: DC
Start: 2020-02-03 — End: 2020-02-03

## 2020-02-03 MED ORDER — HYDROMORPHONE 2 MG/ML INJECTION SOLUTION
2 mg/mL | INTRAMUSCULAR | Status: DC | PRN
Start: 2020-02-03 — End: 2020-02-03

## 2020-02-03 MED ORDER — ACETAMINOPHEN 500 MG TAB
500 mg | Freq: Four times a day (QID) | ORAL | Status: DC
Start: 2020-02-03 — End: 2020-02-03

## 2020-02-03 MED ORDER — PROMETHAZINE 12.5 MG TAB
12.5 mg | ORAL_TABLET | ORAL | 0 refills | Status: DC | PRN
Start: 2020-02-03 — End: 2020-02-17

## 2020-02-03 MED ORDER — GABAPENTIN 300 MG CAP
300 mg | Freq: Three times a day (TID) | ORAL | Status: DC
Start: 2020-02-03 — End: 2020-02-03

## 2020-02-03 MED ORDER — ACETAMINOPHEN 500 MG TAB
500 mg | Freq: Once | ORAL | Status: AC
Start: 2020-02-03 — End: 2020-02-03
  Administered 2020-02-03: 12:00:00 via ORAL

## 2020-02-03 MED ORDER — NALOXONE 0.4 MG/ML INJECTION
0.4 mg/mL | INTRAMUSCULAR | Status: DC | PRN
Start: 2020-02-03 — End: 2020-02-03

## 2020-02-03 MED ORDER — MIDAZOLAM 1 MG/ML IJ SOLN
1 mg/mL | Freq: Once | INTRAMUSCULAR | Status: AC | PRN
Start: 2020-02-03 — End: 2020-02-03
  Administered 2020-02-03: 12:00:00 via INTRAVENOUS

## 2020-02-03 MED ORDER — FAMOTIDINE 20 MG TAB
20 mg | Freq: Once | ORAL | Status: AC
Start: 2020-02-03 — End: 2020-02-03
  Administered 2020-02-03: 12:00:00 via ORAL

## 2020-02-03 MED ORDER — LIDOCAINE (PF) 20 MG/ML (2 %) IJ SOLN
20 mg/mL (2 %) | INTRAMUSCULAR | Status: DC | PRN
Start: 2020-02-03 — End: 2020-02-03
  Administered 2020-02-03: 13:00:00 via INTRAVENOUS

## 2020-02-03 MED ORDER — OXYCODONE 5 MG TAB
5 mg | Freq: Once | ORAL | Status: DC | PRN
Start: 2020-02-03 — End: 2020-02-03

## 2020-02-03 MED ORDER — OXYCODONE 5 MG TAB
5 mg | ORAL | Status: DC | PRN
Start: 2020-02-03 — End: 2020-02-03
  Administered 2020-02-03 (×2): via ORAL

## 2020-02-03 MED ORDER — FENTANYL CITRATE (PF) 50 MCG/ML IJ SOLN
50 mcg/mL | INTRAMUSCULAR | Status: DC | PRN
Start: 2020-02-03 — End: 2020-02-03
  Administered 2020-02-03 (×3): via INTRAVENOUS

## 2020-02-03 MED ORDER — CEFAZOLIN 2 GRAM/20 ML IN STERILE WATER INTRAVENOUS SYRINGE
2 gram/0 mL | Freq: Once | INTRAVENOUS | Status: AC
Start: 2020-02-03 — End: 2020-02-03
  Administered 2020-02-03: 13:00:00 via INTRAVENOUS

## 2020-02-03 MED ORDER — HYDROCODONE-ACETAMINOPHEN 5 MG-325 MG TAB
5-325 mg | ORAL | Status: DC | PRN
Start: 2020-02-03 — End: 2020-02-03

## 2020-02-03 MED ORDER — LIDOCAINE 8 MG/ML INFUSION
8 mg/mL (0. %) | INTRAVENOUS | Status: DC
Start: 2020-02-03 — End: 2020-02-03

## 2020-02-03 MED ORDER — KETOROLAC TROMETHAMINE 30 MG/ML INJECTION
30 mg/mL (1 mL) | Freq: Four times a day (QID) | INTRAMUSCULAR | Status: DC
Start: 2020-02-03 — End: 2020-02-03

## 2020-02-03 MED ORDER — ONDANSETRON 8 MG TAB, RAPID DISSOLVE
8 mg | ORAL | Status: DC | PRN
Start: 2020-02-03 — End: 2020-02-03

## 2020-02-03 MED ORDER — ROCURONIUM 10 MG/ML IV
10 mg/mL | INTRAVENOUS | Status: DC | PRN
Start: 2020-02-03 — End: 2020-02-03
  Administered 2020-02-03: 13:00:00 via INTRAVENOUS

## 2020-02-03 MED ORDER — KETAMINE 50 MG/ML IJ SOLN
50 mg/mL | INTRAMUSCULAR | Status: DC | PRN
Start: 2020-02-03 — End: 2020-02-03
  Administered 2020-02-03 (×3): via INTRAVENOUS

## 2020-02-03 MED ORDER — LACTATED RINGERS IV
INTRAVENOUS | Status: DC
Start: 2020-02-03 — End: 2020-02-03
  Administered 2020-02-03 (×2): via INTRAVENOUS

## 2020-02-03 MED FILL — CEFAZOLIN 2 GRAM/20 ML IN STERILE WATER INTRAVENOUS SYRINGE: 2 gram/0 mL | INTRAVENOUS | Qty: 20

## 2020-02-03 MED FILL — KETAMINE 10 MG/ML IJ SOLN: 10 mg/mL | INTRAMUSCULAR | Qty: 6

## 2020-02-03 MED FILL — SCOPOLAMINE (1.3-1.5) MG 72 HR TRANSDERM PATCH: 1 mg over 3 days | TRANSDERMAL | Qty: 1

## 2020-02-03 MED FILL — FAMOTIDINE 20 MG TAB: 20 mg | ORAL | Qty: 1

## 2020-02-03 MED FILL — LIDOCAINE 8 MG/ML INFUSION: 8 mg/mL (0. %) | INTRAVENOUS | Qty: 250

## 2020-02-03 MED FILL — MIDAZOLAM 1 MG/ML IJ SOLN: 1 mg/mL | INTRAMUSCULAR | Qty: 2

## 2020-02-03 MED FILL — FENTANYL CITRATE (PF) 50 MCG/ML IJ SOLN: 50 mcg/mL | INTRAMUSCULAR | Qty: 2

## 2020-02-03 MED FILL — SENSORCAINE-MPF 0.5 % (5 MG/ML) INJECTION SOLUTION: 0.5 % (5 mg/mL) | INTRAMUSCULAR | Qty: 30

## 2020-02-03 MED FILL — OXYCODONE 5 MG TAB: 5 mg | ORAL | Qty: 2

## 2020-02-03 MED FILL — CELEBREX 100 MG CAPSULE: 100 mg | ORAL | Qty: 1

## 2020-02-03 MED FILL — TYLENOL EXTRA STRENGTH 500 MG TABLET: 500 mg | ORAL | Qty: 2

## 2020-02-03 MED FILL — OXYCODONE 5 MG TAB: 5 mg | ORAL | Qty: 1

## 2020-02-03 NOTE — Op Note (Signed)
Operative Report    Patient: Victoria Shaw MRN: [id number]  SSN: [id number]    Date of Birth: [birth year]  Age: 49 y.o.  Sex: female      Date of Surgery: 02/03/2020      Preoperative Diagnosis: Menometrorrhagia [N92.1]    Postoperative Diagnosis: Menometrorrhagia [N92.1] 2. Endometriosis.  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    Surgeon(s):  Milissa Fesperman, Tinnie Gens, MD    Anesthesia: General    Procedure: Robotic Assisted Total Laparoscopic Hysterectomy less than 250 grams WITH Bilateral salpingectomy.  2. Rogelio Seen culdoplasty   3. Excision of deep endometriosis.    Findings:  fibroid uterus, enlarged uterus and endometriosis. Endometriosis in a deep peritoneal pocket.  Patient also noted to have isolated moderate uterine prolapse d/t incompetent pubocervical fascia.    Estimated Blood Loss:  50 mL    Drains: none    Specimens:    ID Type Source Tests Collected by Time Destination   1 : uterus, cervix, bilateral fallopian tubes Fresh   Tomasa Hosteller, MD 02/03/[id number] Pathology   2 : PERITONEAL BIOPSY  Preservative   Tomasa Hosteller, MD 02/03/2020 [phone] Pathology       DVT Prophylaxis: SCD Hose    Antibiotic Prophylaxis: Ancef    Procedure Details:  After an adequate level of anesthesia was obtained, the patient was prepped and draped in the usual sterile fashion in the dorsal lithotomy position. Time out was done to confirm the operating procedure, surgeon, patient, pertinent data, and site.  Once confirmed by the team, the procedure was started. A weighted speculum was placed in the vagina and the cervix was grasped with a tenaculum. The uterus was sounded to a depth of 9 cm and cervix dilated. The Rumi-Koh manipulator was placed and balloon inflated. Instruments removed and gloves changed. An infraumbilical incision was made after first injecting with 0.5% marcaine, and the Veress needle inserted insufflating the peritoneal cavity with CO2. Opening pressure was noted to be .  Once  the pressure reached , the Veress was removed and trocar was placed. Scope was inserted; there was no bleeding and no evidence of visceral injury. Under visualization one 11mm lateral port was placed on each side. 62mm assistant port was placed in the left upper quadrant. The robot was then attached to the trocars and the instruments were placed under visualization. I broke scrub and went to the console. Anatomic survey performed and findings noted as above. The ureters were identified and traced on both sides.  Each tube was grasped, elevated and dissected free across the tubo-ovarian ligament, mesosalpinx and then proximal tube and withdrawn through the trocar. On each side, the ovarian ligament was clamped, coagulated, and divided using the Monopolar Scissors and Vessel Sealer. The dissection was then taken down through the  round ligament. The anterior and posterior peritoneum was taken down on each side, skeletonizing the uterine arteries. An area of endometriosis with deep peritoneal pocket was noted in the right cul de sac with endometriosis at the base. The opening was 2-3 mm initially. The area was opened up bluntly.  The pocket extended 1 - 1.5 deep and 1-1.5 wide at the base. The endometriosis was  Cauterized.  The peritoneum of the pocket was then excised with scissors.     Next, The Bladder flap was then advanced and the bladder dissected down below the Koh-ring. The uterine arteries were clamped, coagulated, and cut across the ascending branches. Cardinal ligament was developed. The cardinal ligaments were taken  down to the Koh-Ring and the cervico-vaginal junction was circumscribed anteriorly and posteriorly. Hemostasis was noted. The uterus was removed. Occluder bulb placed in the vagina to maintain pneumo. The vaginal cuff was closed with 0 V-Loc with running stitches. Bulb was removed.  A McCalls culdoplasty suture was placed using 0 ethibond on the Endostitch, starting at the right [place] ligament  to the left.  The suture was tightened checking the ureters for kinking. The McCalls suture was then tied, bringing the [place] ligaments and posterior vaginal peritoneum to the midline. The ureters were identified and traced on both sides and noted to have good peristalsis well lateral. The pneumoperitoneum was reduced to approximately 8 mmHg for several minutes (after the McCalls was tied) to watch for bleeding. The area was irrigated free of blood clots and debris. Hemostasis was noted at all points.  The robot was detached. I again scrubbed and returned to the patient.   Foley was removed.   .  The pelvis was irrigated and suctioned.  I noticed bleeding from a vein opening on the right, just to the edge of the cuff.  It was grasped with Marilyn's, elevated, and a 43mm clip was applied.  Good hemostasis.  The pelvis was irrigated and suctioned again.  Next we reduced the peritoneum, removed the trocars, and closed each of the port sites with 4-0 moncryl & Surgical adhesive on the skin. The patient tolerated the procedure well. She was cleaned up, returned to the flat supine position, awakened, and went to the PACU in stable condition.  Counts were correct.    Signed By: Tomasa Hosteller, MD     February 03, 2020

## 2020-02-03 NOTE — Progress Notes (Signed)
Pt has now voided a total of 3 times.  Tolerated reg diet and moving in the room well.  Pain responded well to oxycodone.

## 2020-02-03 NOTE — Interval H&P Note (Signed)
Victoria Shaw husband [phone]

## 2020-02-03 NOTE — Progress Notes (Signed)
Patient admitted for hysterectomy.    Chart reviewed - no needs identified in chart review.    SW will continue to follow.    Elizabeth Z. McKinney, LISW-CP  [facility]   [phone]

## 2020-02-03 NOTE — Progress Notes (Signed)
Oxycodone 10 mg given.  Water Po.

## 2020-02-03 NOTE — Progress Notes (Signed)
SCD's off.  Up to BR.  Void 100 ml.  Peri care done.  IV saline locked.

## 2020-02-03 NOTE — Anesthesia Pre-Procedure Evaluation (Signed)
Relevant Problems   No relevant active problems       Anesthetic History   No history of anesthetic complications            Review of Systems / Medical History  Patient summary reviewed and pertinent labs reviewed    Pulmonary  Within defined limits                 Neuro/Psych   Within defined limits           Cardiovascular                  Exercise tolerance: >4 METS     GI/Hepatic/Renal  Within defined limits              Endo/Other  Within defined limits           Other Findings              Physical Exam    Airway  Mallampati: II  TM Distance: 4 - 6 cm  Neck ROM: normal range of motion   Mouth opening: Normal     Cardiovascular  Regular rate and rhythm,  S1 and S2 normal,  no murmur, click, rub, or gallop             Dental         Pulmonary  Breath sounds clear to auscultation               Abdominal         Other Findings            Anesthetic Plan    ASA: 1  Anesthesia type: general          Induction: Intravenous  Anesthetic plan and risks discussed with: Patient and Spouse

## 2020-02-03 NOTE — Progress Notes (Signed)
SCD on.  IS at bedside.  Pain 10/10.  Orders released.

## 2020-02-03 NOTE — Progress Notes (Signed)
SBAR report received from [employer].  VSS.  Resting with eyes closed.

## 2020-02-03 NOTE — Interval H&P Note (Signed)
 TRANSFER - OUT REPORT:    Verbal report given to whittney RN (name) on Brynli Beddow  being transferred to 436(unit) for routine post - op       Report consisted of patient's Situation, Background, Assessment and   Recommendations(SBAR).     Information from the following report(s) SBAR, Kardex, OR Summary, Procedure Summary, Intake/Output and MAR was reviewed with the receiving nurse.    Lines:   Peripheral IV 02/03/20 Left; Posterior Forearm (Active)   Site Assessment Clean, dry, & intact 02/03/20 1035   Phlebitis Assessment 0 02/03/20 1035   Infiltration Assessment 0 02/03/20 1035   Dressing Status Clean, dry, & intact 02/03/20 1035   Dressing Type Tape; Transparent 02/03/20 1035   Hub Color/Line Status Infusing 02/03/20 1015   Action Taken Blood drawn 02/03/20 9340        Opportunity for questions and clarification was provided.      Patient transported with:   O2 @ 2 liters

## 2020-02-03 NOTE — Progress Notes (Signed)
Discharge instructions given.  Iv removed with tip intact.  States understanding.  Discharge out to personal auto with husband.

## 2020-02-03 NOTE — Anesthesia Post-Procedure Evaluation (Signed)
Procedure(s):  ERAS/ HYSTERECTOMY ROBOTIC ASSISTED BILATERAL SALPINGECTOMY.    general    Anesthesia Post Evaluation      Multimodal analgesia: multimodal analgesia used between 6 hours prior to anesthesia start to PACU discharge  Patient location during evaluation: PACU  Patient participation: complete - patient participated  Level of consciousness: awake and awake and alert  Pain management: adequate  Airway patency: patent  Anesthetic complications: no  Cardiovascular status: acceptable  Respiratory status: acceptable  Hydration status: acceptable  Post anesthesia nausea and vomiting:  controlled      INITIAL Post-op Vital signs:   Vitals Value Taken Time   BP 111/55 02/03/20 1045   Temp 37.2 ??C (98.9 ??F) 02/03/20 1011   Pulse 76 02/03/20 1048   Resp 16 02/03/20 1045   SpO2 100 % 02/03/20 1048   Vitals shown include unvalidated device data.

## 2020-02-17 ENCOUNTER — Ambulatory Visit
Admit: 2020-02-17 | Discharge: 2020-02-17 | Payer: PRIVATE HEALTH INSURANCE | Attending: Obstetrics & Gynecology | Primary: Internal Medicine

## 2020-02-17 ENCOUNTER — Ambulatory Visit: Attending: Obstetrics & Gynecology | Primary: Internal Medicine

## 2020-02-17 DIAGNOSIS — Z90710 Acquired absence of both cervix and uterus: Secondary | ICD-10-CM

## 2020-02-17 NOTE — Progress Notes (Signed)
Victoria Shaw presents for postop visit from Total Laparoscopic Hysterectomy, Bilateral Salpingectomy and McCalls about 2 weeks ago.  Doing well, no concerns.  Just some lateral abd pain, initially left, recently right, no UTI sx, pain feels muscular and does NOT radiate to back or kidneys, not taking ANYthing for pain. Is with a small amount of bleeding.  Fever: NO Voiding well: YES.  Bowel movements OK: YES.     Exam: A&OX3, NAD.  Abdomen:  Non tender Incisions clean, dry, intact  UA negative    A/P.  Stable Post op condition.  Gradually increase activity. Resumption of sexual activity is not encouraged at this time.  Follow up 6 weeks. Will also plan FNA of right breast cyst at that time as she is moderately symptomatic.

## 2020-03-31 ENCOUNTER — Ambulatory Visit
Admit: 2020-03-31 | Discharge: 2020-03-31 | Payer: PRIVATE HEALTH INSURANCE | Attending: Obstetrics & Gynecology | Primary: Internal Medicine

## 2020-03-31 ENCOUNTER — Ambulatory Visit: Attending: Obstetrics & Gynecology | Primary: Internal Medicine

## 2020-03-31 DIAGNOSIS — Z09 Encounter for follow-up examination after completed treatment for conditions other than malignant neoplasm: Secondary | ICD-10-CM

## 2020-03-31 NOTE — Progress Notes (Signed)
Nico presents for postop visit from Total Laparoscopic Hysterectomy about 8 weeks ago.  Doing well; no concerns. Denies any bleeding.  Fever: NO Voiding well: YES.  Bowel movements OK: YES.     Exam: A&OX3, NAD.  Abdomen:  Non tender Incisions clean, dry, intact.  Spec exam cuff intact, bimanual confirms.    A/P.  Stable Post op condition.  Gradually increase activity. Resumption of sexual activity is encouraged at this time.  Follow up prn or for scheduled health screenings.     Also c/o masses in both breasts. Had right breast US 2 months ago showing a 3.6cm cystic mass (report reviewed).  It is mildly tender to palpation.  No skin changes or nipple discharge.    Now also with pain and possible mass on the left lower.  Exam of this area reveals very dense breasts and a tender mass @ 3-4:00. No nipple d/c or skin changes.    BREAST CYST ASPIRATION      Date:  03/31/2020    Name:  Victoria Shaw     DOB:  05/26/1970    Age:  50 y.o.    History:  Painful breast cyst      Procedure:    Skin was cleansed with betadine.  Area over the mass was infiltrated with plain 1% xylocaine.  Syringe with 20g needle was used to aspirate 9cc of dark brown fluid.  This was sent for cytology.  After aspiration, the mass was completely resolved.  Patient tolerated the procedure well.      Follow-up plan:     Diagnoses and all orders for this visit:    1. Postop check - stable recovery.    2. Benign breast cyst in female, right  -     PUNC/ASPIR BREAST CYST    3. Left breast mass - prob another cyst, but feels deeper.  Rec [place] to eval.  Last Korea on left reviewed from 3.5 yrs ago and mass was in different location  -     US BREAST LT LIMITED=<3 QUAD; Future         Follow-up and Dispositions    ?? Return in about 10 months (around 01/29/2021), or if symptoms worsen or fail to improve, for Annual.               Tomasa Hosteller, MD

## 2020-04-01 ENCOUNTER — Encounter

## 2020-04-01 LAB — BREAST DISCHARGE CYTOLOGY

## 2020-04-08 ENCOUNTER — Inpatient Hospital Stay: Admit: 2020-04-08 | Payer: PRIVATE HEALTH INSURANCE | Attending: Obstetrics & Gynecology | Primary: Internal Medicine

## 2020-04-08 DIAGNOSIS — N6320 Unspecified lump in the left breast, unspecified quadrant: Secondary | ICD-10-CM

## 2020-04-21 NOTE — Telephone Encounter (Signed)
Pt called stating her left breast imaging confirmed a breast cyst and aspiration was recommended. Pt would like Dr.Heidtman to aspirate cyst vs scheduling procedure at [facility], [place]. Will confirm with Dr.Heidtman that we can do this in office. Pt notified I would call her with his recommendations.

## 2020-04-22 NOTE — Telephone Encounter (Signed)
Pt notified. Appointment made for 04/26/20.

## 2020-04-22 NOTE — Telephone Encounter (Signed)
-----   Message from Tomasa Hosteller, MD sent at 04/22/2020 10:59 AM EST -----  Regarding: RE: breast cyst  Yes  I may need to use our hand held [place]  ----- Message -----  From: Darel Hong, RN  Sent: 04/21/2020  12:35 PM EST  To: Tomasa Hosteller, MD  Subject: breast cyst                                      Pt called stating her left breast imaging confirmed a breast cyst and aspiration was recommended. Pt would like you to aspirate cyst vs scheduled at [facility]. Ok to schedule?

## 2020-04-26 ENCOUNTER — Ambulatory Visit
Admit: 2020-04-26 | Discharge: 2020-04-26 | Payer: PRIVATE HEALTH INSURANCE | Attending: Obstetrics & Gynecology | Primary: Internal Medicine

## 2020-04-26 ENCOUNTER — Ambulatory Visit: Attending: Obstetrics & Gynecology | Primary: Internal Medicine

## 2020-04-26 DIAGNOSIS — N6320 Unspecified lump in the left breast, unspecified quadrant: Secondary | ICD-10-CM

## 2020-04-26 NOTE — Progress Notes (Signed)
Victoria Shaw presents to discuss the breasts on the left.  Per the [place] report, there are several, largest of which is 40mm.  She has occasional discomfort in this area but nothing like the large one on the right which we successfully drained a month ago.    On exam I am able to find at least one small cyst but it is nontender.    Diagnoses and all orders for this visit:    1. Left breast mass - left breasts seen as per [place], do not recommend intervention at this time as she is relatively asymptomatic. If needed, can always refer back to radiology for US guided I&D, however even that I would discourage at this time. PT agrees.           Follow-up and Dispositions    ?? Return if symptoms worsen or fail to improve.

## 2020-06-22 MED ORDER — FLUCONAZOLE 150 MG TAB
150 mg | ORAL_TABLET | ORAL | 0 refills | Status: AC
Start: 2020-06-22 — End: ?

## 2020-11-28 ENCOUNTER — Encounter

## 2020-12-01 ENCOUNTER — Ambulatory Visit: Primary: Internal Medicine

## 2020-12-08 ENCOUNTER — Inpatient Hospital Stay: Admit: 2020-12-08 | Payer: PRIVATE HEALTH INSURANCE | Primary: Internal Medicine

## 2020-12-08 ENCOUNTER — Encounter

## 2020-12-08 DIAGNOSIS — Z1231 Encounter for screening mammogram for malignant neoplasm of breast: Secondary | ICD-10-CM

## 2021-01-20 ENCOUNTER — Encounter: Attending: Obstetrics & Gynecology | Primary: Internal Medicine

## 2021-01-20 ENCOUNTER — Ambulatory Visit
Admit: 2021-01-20 | Discharge: 2021-01-20 | Payer: PRIVATE HEALTH INSURANCE | Attending: Obstetrics & Gynecology | Primary: Internal Medicine

## 2021-01-20 DIAGNOSIS — Z01419 Encounter for gynecological examination (general) (routine) without abnormal findings: Secondary | ICD-10-CM

## 2021-01-20 NOTE — Progress Notes (Signed)
Victoria Shaw is 50 y.o. female, G3P2010, who presents today for a routine annual gynecological examination.No LMP recorded. Patient has had a hysterectomy. ([facility] / [facility] 02/2020). Doing well.  No Gyn, Breast, G/U, or GI complaints.       Mammogram/Pap Hx 01/20/2021   Mammogram Date 12/08/2020   Mammogram Result wnl   Pap Date 01/19/2020   Pap Result wnl     GYN Intake Questionnaire 01/20/2021   Dyspareunia None   Post-Coital Bleeding None   Date of Mammogram 12/08/2020   Result of Mammogram wnl   Date of Pap 01/19/2020   Pap result wnl       Contraception: postmenopausal  Has received Gardisil: NO  Last Colo less than one years ago  Last time cholesterol was checked: 1 years ago    OB History:   OB History   Gravida Para Term Preterm AB Living   3 2 2   1      SAB IAB Ectopic Molar Multiple Live Births   1                # Outcome Date GA Lbr Len/2nd Weight Sex Delivery Anes PTL Lv   3 SAB            2 Term            1 Term                  Health Maintenance Due   Topic Date Due    COVID-19 Vaccine (1) Never done    Depression Screen  Never done    HIV screen  Never done    Hepatitis C screen  Never done    Diabetes screen  Never done    Lipids  Never done    Shingles vaccine (2 of 2) 06/23/2020    DTaP/Tdap/Td vaccine (2 - Td or Tdap) 06/26/2020    Flu vaccine (1) 10/03/2020         GYN History            Sydnie  has a past medical history of Allergic rhinitis and Interstitial cystitis. [name]    Her surgeries include  has a past surgical history that includes Dilation and curettage of uterus (2002); salpingectomy (02/03/2020); Breast biopsy; Breast cyst incision and drainage; Laparoscopic hysterectomy (02/03/2020); and Wisdom tooth extraction..    Allergies   Allergen Reactions    Hydrocodone-Acetaminophen Other (See Comments)     Hallucinations         Her current meds are:   Current Outpatient Medications   Medication Sig    Fluticasone Propionate (FLONASE NA) by Nasal route    VITAMIN D PO Take by mouth     Ascorbic Acid (VITAMIN C PO) Take by mouth    Cetirizine HCl (ZYRTEC ALLERGY) 10 MG CAPS Take by mouth    ibuprofen (ADVIL;MOTRIN) 800 MG tablet Take 800 mg by mouth every 8 hours as needed    Melatonin 5 MG CAPS Take 5 mg by mouth    acetaminophen (TYLENOL) 325 MG tablet Take 500 mg by mouth every 4 hours as needed (Patient not taking: Reported on 01/20/2021)    docusate (COLACE, DULCOLAX) 100 MG CAPS Take 100 mg by mouth 2 times daily (Patient not taking: Reported on 01/20/2021)    fluconazole (DIFLUCAN) 150 MG tablet Take 1 tablet by mouth now, repeat dose in 3-4 days. (Patient not taking: Reported on 01/20/2021)     No current facility-administered medications for  this visit.          Family history is significant for family history includes Heart Disease in her father.[name]  reports that she has quit smoking. She has never used smokeless tobacco. She reports that she does not drink alcohol and does not use drugs.     She completed her Systems Review which is documented below.  Any positive systems not related to Gyn are recommended to discuss with her PCP.    Review of Systems   Constitutional: Negative.  Negative for unexpected weight change.   HENT: Negative.     Eyes: Negative.    Respiratory: Negative.  Negative for cough and shortness of breath.    Cardiovascular: Negative.  Negative for chest pain and palpitations.   Gastrointestinal: Negative.  Negative for abdominal pain and blood in stool.   Endocrine: Negative.    Genitourinary: Negative.  Negative for difficulty urinating, dysuria, menstrual problem, pelvic pain and urgency.   Musculoskeletal: Negative.    Skin: Negative.    Allergic/Immunologic: Negative.    Neurological: Negative.    Hematological: Negative.    Psychiatric/Behavioral: Negative.  Negative for behavioral problems and confusion.    All other systems reviewed and are negative.    No results found for this visit on 01/20/21.   Blood pressure 130/72, height 5\' 2"  (1.575 m), weight  138 lb (62.6 kg).   Body mass index is 25.24 kg/m??.   Wt Readings from Last 3 Encounters:   01/20/21 138 lb (62.6 kg)   04/26/20 135 lb (61.2 kg)   03/31/20 135 lb (61.2 kg)      Physical Exam  Vitals reviewed. Exam conducted with a chaperone present.   Constitutional:       General: She is not in acute distress.     Appearance: Normal appearance.   HENT:      Head: Normocephalic and atraumatic.   Eyes:      Extraocular Movements: Extraocular movements intact.      Pupils: Pupils are equal, round, and reactive to light.   Chest:   Breasts:     Breasts are symmetrical.      Right: Tenderness present. No bleeding, inverted nipple, mass, nipple discharge or skin change.      Left: Tenderness present. No bleeding, inverted nipple, mass, nipple discharge or skin change.      Comments: Very dense.  Abdominal:      General: Abdomen is flat. There is no distension.      Palpations: Abdomen is soft. There is no mass.      Tenderness: There is no abdominal tenderness. There is no guarding or rebound.      Hernia: No hernia is present. There is no hernia in the left inguinal area or right inguinal area.   Genitourinary:     General: Normal vulva.      Exam position: Lithotomy position.      Labia:         Right: No rash, tenderness or lesion.         Left: No rash, tenderness or lesion.       Vagina: Normal.      Uterus: Absent.       Adnexa: Right adnexa normal and left adnexa normal.        Right: No tenderness.          Left: No tenderness.        Comments: Uterus and cervix surgically absent.  Upper cuff  well supported  Musculoskeletal:         General: Normal range of motion.      Cervical back: Normal range of motion and neck supple.   Lymphadenopathy:      Upper Body:      Right upper body: No axillary adenopathy.      Left upper body: No axillary adenopathy.   Skin:     General: Skin is warm and dry.   Neurological:      General: No focal deficit present.      Mental Status: She is alert and oriented to person, place,  and time.   Psychiatric:         Mood and Affect: Mood normal.         Behavior: Behavior normal.         Thought Content: Thought content normal.         Judgment: Judgment normal.        Assessment/plan: Elma was seen today for Annual Exam       Jerri was seen today for annual exam.    Diagnoses and all orders for this visit:    Well woman exam with routine gynecological exam    Encounter for screening mammogram for breast cancer  -     MAM TOMO DIGITAL SCREEN BILATERAL; Future    Dense breasts - annual MGM + CBE< frequent SBE encouraged.  Try garners for herbal supplements to help with tenderness.  Limit caffeine.       Return in about 1 year (around 01/20/2022) for Annual, Breast Check.

## 2021-12-11 ENCOUNTER — Inpatient Hospital Stay: Admit: 2021-12-11 | Payer: PRIVATE HEALTH INSURANCE | Attending: Obstetrics & Gynecology | Primary: Internal Medicine

## 2021-12-11 DIAGNOSIS — Z1231 Encounter for screening mammogram for malignant neoplasm of breast: Secondary | ICD-10-CM

## 2022-01-24 ENCOUNTER — Ambulatory Visit: Admit: 2022-01-24 | Discharge: 2022-01-24 | Payer: PRIVATE HEALTH INSURANCE | Primary: Internal Medicine

## 2022-01-24 DIAGNOSIS — R300 Dysuria: Secondary | ICD-10-CM

## 2022-01-24 LAB — AMB POC URINALYSIS DIP STICK AUTO W/O MICRO
Bilirubin, Urine, POC: NEGATIVE
Blood (UA POC): NEGATIVE
Glucose, Urine, POC: NEGATIVE
KETONES, Urine, POC: NEGATIVE
Leukocyte Esterase, Urine, POC: NEGATIVE
Nitrite, Urine, POC: NEGATIVE
Protein, Urine, POC: NEGATIVE
Specific Gravity, Urine, POC: 1 — AB (ref 1.001–1.035)
Urobilinogen, POC: NORMAL
pH, Urine, POC: 7.5 (ref 4.6–8.0)

## 2022-01-24 MED ORDER — AMITRIPTYLINE HCL 10 MG PO TABS
10 MG | ORAL_TABLET | Freq: Every evening | ORAL | 0 refills | Status: AC
Start: 2022-01-24 — End: 2022-02-23

## 2022-01-24 NOTE — Progress Notes (Signed)
Gyn followup note  Victoria Shaw is a 51 y.o. female G3P2010  who presents as a followup for a week for pelvic pain, dysuria. Reports worse on the right side. Hx of IC. She usually manages at home increasing hydration and avoiding triggers. Sometimes constipation.  Sp a hysterectomy with ovaries intact. Reports hot flashes    Physical Examination:  BP 112/80   Ht 1.575 m (5\' 2" )   Wt 56.7 kg (125 lb)   BMI 22.86 kg/m    Gen: AAOx3  Pulm: normal effort  Ab: soft  GU: normal external genitalia, normal vaginal cuff, bimanual tender over bladder   Skin: no edema    An approved medical chaperone was present for all sensitive portions of the above exam: Vaughan Basta, MA    Plan:  --UA negative, will send for urine culture  --suspect likely flareup of IC, discussed treatment options, will refer to urology, offered amitriptyline, patient interested in trying

## 2022-01-27 LAB — CULTURE, URINE: Culture: <10000

## 2022-02-02 ENCOUNTER — Encounter

## 2022-02-02 ENCOUNTER — Ambulatory Visit
Admit: 2022-02-02 | Discharge: 2022-02-02 | Payer: PRIVATE HEALTH INSURANCE | Attending: Obstetrics & Gynecology | Primary: Internal Medicine

## 2022-02-02 DIAGNOSIS — Z01419 Encounter for gynecological examination (general) (routine) without abnormal findings: Secondary | ICD-10-CM

## 2022-02-02 LAB — FOLLICLE STIMULATING HORMONE: FSH: 34.6 m[IU]/mL

## 2022-02-02 MED ORDER — HYDROXYZINE HCL 25 MG PO TABS
25 MG | ORAL_TABLET | Freq: Three times a day (TID) | ORAL | 3 refills | Status: AC | PRN
Start: 2022-02-02 — End: ?

## 2022-02-02 NOTE — Progress Notes (Signed)
Victoria Shaw is 51 y.o. female, G3P2010, who presents today for a routine annual gynecological examination.No LMP recorded. Patient has had a hysterectomy. . Had a recent IC flare.  Took atarax in the past, did not want to try the amitriptyline. Wants to retry the atarax  Ow Doing well.  No Gyn, Breast, G/U, or GI complaints.  Minimal PMP sx.           02/02/2022     8:00 AM 01/20/2021     9:00 AM   Mammogram/Pap Hx   Mammogram Date 12/11/2021 12/08/2020   Mammogram Result benign wnl   Pap Date 01/19/2020 01/19/2020   Pap Result neg wnl         02/02/2022     8:00 AM 01/20/2021     9:00 AM   GYN Intake Questionnaire   Current Form of Contraception Hysterectomy    Dyspareunia Deep None   Post-Coital Bleeding None None   Date of Mammogram 12/11/2021 12/08/2020   Result of Mammogram benign wnl   Date of Pap 01/19/2020 01/19/2020   Pap result neg wnl       Contraception:  hysterectomy  Has received Gardisil: NO  Last Colo 2 years ago  Last time cholesterol was checked: 1 years ago    OB History:   OB History   Gravida Para Term Preterm AB Living   3 2 2   1      SAB IAB Ectopic Molar Multiple Live Births   1         2      # Outcome Date GA Lbr Len/2nd Weight Sex Delivery Anes PTL Lv   3 SAB            2 Term            1 Term                  Health Maintenance Due   Topic Date Due    Hepatitis B vaccine (1 of 3 - 3-dose series) Never done    COVID-19 Vaccine (1) Never done    Depression Screen  Never done    HIV screen  Never done    Hepatitis C screen  Never done    Lipids  Never done    Shingles vaccine (1 of 2) Never done    DTaP/Tdap/Td vaccine (2 - Td or Tdap) 06/26/2020    Flu vaccine (1) 10/03/2021         GYN History            Quetzal  has a past medical history of Allergic rhinitis and Interstitial cystitis. [name]    Her surgeries include  has a past surgical history that includes Dilation and curettage of uterus (2002); salpingectomy (02/03/2020); Breast biopsy; Breast cyst incision and drainage; Laparoscopic  hysterectomy (02/03/2020); and Wisdom tooth extraction..    Allergies   Allergen Reactions    Hydrocodone-Acetaminophen Other (See Comments)     Hallucinations         Her current meds are:   Current Outpatient Medications   Medication Sig    hydrOXYzine HCl (ATARAX) 25 MG tablet Take 1 tablet by mouth every 8 hours as needed (IC symptoms)    amitriptyline (ELAVIL) 10 MG tablet Take 1 tablet by mouth nightly    Fluticasone Propionate (FLONASE NA) by Nasal route    VITAMIN D PO Take by mouth    Ascorbic Acid (VITAMIN C PO) Take by mouth  Cetirizine HCl (ZYRTEC ALLERGY) 10 MG CAPS Take by mouth     No current facility-administered medications for this visit.          Family history is significant for family history includes Heart Disease in her father.Langley Gauss  reports that she has quit smoking. She has never used smokeless tobacco. She reports that she does not drink alcohol and does not use drugs.     She completed her Systems Review which is documented below.  Any positive systems not related to Gyn are recommended to discuss with her PCP.    Review of Systems   Constitutional: Negative.  Negative for unexpected weight change.   HENT: Negative.     Eyes: Negative.    Respiratory: Negative.  Negative for cough and shortness of breath.    Cardiovascular: Negative.  Negative for chest pain and palpitations.   Gastrointestinal: Negative.  Negative for abdominal pain and blood in stool.   Endocrine: Negative.    Genitourinary: Negative.  Negative for difficulty urinating, dysuria, menstrual problem, pelvic pain and urgency.   Musculoskeletal: Negative.    Skin: Negative.    Allergic/Immunologic: Negative.    Neurological: Negative.    Hematological: Negative.    Psychiatric/Behavioral: Negative.  Negative for behavioral problems and confusion.    All other systems reviewed and are negative.      No results found for this visit on 02/02/22.   Blood pressure 108/66, height 1.575 m (5\' 2" ), weight 57.6 kg (127 lb).    Body mass index is 23.23 kg/m.   Wt Readings from Last 3 Encounters:   02/02/22 57.6 kg (127 lb)   01/24/22 56.7 kg (125 lb)   12/11/21 56.7 kg (125 lb)      Physical Exam  Vitals reviewed. Exam conducted with a chaperone present.   Constitutional:       General: She is not in acute distress.     Appearance: Normal appearance.   HENT:      Head: Normocephalic and atraumatic.   Eyes:      Extraocular Movements: Extraocular movements intact.      Pupils: Pupils are equal, round, and reactive to light.   Chest:   Breasts:     Breasts are symmetrical.      Right: Tenderness present. No bleeding, inverted nipple, mass, nipple discharge or skin change.      Left: Tenderness present. No bleeding, inverted nipple, mass, nipple discharge or skin change.      Comments: Very heterogeneously dense  Abdominal:      General: Abdomen is flat. There is no distension.      Palpations: Abdomen is soft. There is no mass.      Tenderness: There is no abdominal tenderness. There is no guarding or rebound.      Hernia: No hernia is present. There is no hernia in the left inguinal area or right inguinal area.   Genitourinary:     General: Normal vulva.      Exam position: Lithotomy position.      Labia:         Right: No rash, tenderness or lesion.         Left: No rash, tenderness or lesion.       Vagina: Normal.      Uterus: Absent.       Adnexa: Right adnexa normal and left adnexa normal.        Right: No tenderness.  Left: No tenderness.        Comments: Uterus and cervix surgically absent  Musculoskeletal:         General: Normal range of motion.      Cervical back: Normal range of motion and neck supple.   Lymphadenopathy:      Upper Body:      Right upper body: No axillary adenopathy.      Left upper body: No axillary adenopathy.   Skin:     General: Skin is warm and dry.   Neurological:      General: No focal deficit present.      Mental Status: She is alert and oriented to person, place, and time.   Psychiatric:          Mood and Affect: Mood normal.         Behavior: Behavior normal.         Thought Content: Thought content normal.         Judgment: Judgment normal.          Assessment/plan: Keyunna was seen today for Annual Exam       Palmina was seen today for annual exam.    Diagnoses and all orders for this visit:    Well woman exam with routine gynecological exam    Encounter for screening mammogram for malignant neoplasm of breast  -     MAM TOMO DIGITAL SCREEN BILATERAL; Future    Interstitial cystitis (chronic) without hematuria  -     hydrOXYzine HCl (ATARAX) 25 MG tablet; Take 1 tablet by mouth every 8 hours as needed (IC symptoms)    Dense breasts - NL MGM 2 mos ago. try evening primrose    Menopausal symptoms  -     Follicle Stimulating Hormone; Future         Return in about 1 year (around 02/03/2023) for Annual.

## 2022-03-23 ENCOUNTER — Emergency Department: Admit: 2022-02-11 | Payer: PRIVATE HEALTH INSURANCE | Attending: Adult Health | Primary: Internal Medicine

## 2022-04-13 ENCOUNTER — Encounter: Payer: PRIVATE HEALTH INSURANCE | Attending: Urology | Primary: Internal Medicine

## 2022-06-08 ENCOUNTER — Encounter: Payer: PRIVATE HEALTH INSURANCE | Attending: Adult Health | Primary: Internal Medicine

## 2022-12-14 ENCOUNTER — Inpatient Hospital Stay: Admit: 2022-12-14 | Payer: PRIVATE HEALTH INSURANCE | Attending: Obstetrics & Gynecology | Primary: Internal Medicine

## 2022-12-14 DIAGNOSIS — Z1231 Encounter for screening mammogram for malignant neoplasm of breast: Secondary | ICD-10-CM

## 2023-02-05 ENCOUNTER — Encounter: Admit: 2023-02-05 | Payer: PRIVATE HEALTH INSURANCE | Admitting: Obstetrics & Gynecology | Primary: Internal Medicine

## 2023-02-05 VITALS — BP 110/68 | Ht 62.0 in | Wt 125.0 lb

## 2023-02-05 DIAGNOSIS — Z01419 Encounter for gynecological examination (general) (routine) without abnormal findings: Secondary | ICD-10-CM

## 2023-02-05 MED ORDER — ESTRADIOL 0.1 MG/GM VA CREA
0.1 | VAGINAL | 5 refills | Status: DC
Start: 2023-02-05 — End: 2024-02-07

## 2023-02-05 NOTE — Progress Notes (Signed)
 Victoria Shaw is 52 y.o. female, G3P2010, who presents today for a routine annual gynecological examination.No LMP recorded. Patient has had a hysterectomy. . Reports c/o dyspareunia related to vaginal dryness / burning. Minimal hot flashes. Ow Doing

## 2023-12-16 ENCOUNTER — Inpatient Hospital Stay: Admit: 2023-12-16 | Payer: PRIVATE HEALTH INSURANCE | Attending: Obstetrics & Gynecology | Primary: Internal Medicine

## 2023-12-16 VITALS — Ht 62.0 in | Wt 125.0 lb

## 2023-12-16 DIAGNOSIS — Z1231 Encounter for screening mammogram for malignant neoplasm of breast: Principal | ICD-10-CM

## 2024-02-07 ENCOUNTER — Ambulatory Visit
Admit: 2024-02-07 | Discharge: 2024-02-07 | Payer: PRIVATE HEALTH INSURANCE | Attending: Obstetrics & Gynecology | Primary: Internal Medicine

## 2024-02-07 VITALS — BP 125/70 | Ht 62.0 in | Wt 126.4 lb

## 2024-02-07 DIAGNOSIS — Z01419 Encounter for gynecological examination (general) (routine) without abnormal findings: Principal | ICD-10-CM

## 2024-02-07 NOTE — Progress Notes (Signed)
 "Victoria Shaw is 53 y.o. female, G3P2010, who presents today for a routine annual gynecological examination.No LMP recorded. Patient has had a hysterectomy. . Never used the vERT, but doing OK.  Doing well.  No Gyn, Breast, G/U, or GI complaints.       Date of last Mammogram: 12/16/2023   - result: Normal  Date of last Cervical Cancer screen (HPV or PAP): 01/19/2020         02/02/2022     8:00 AM 01/20/2021     9:00 AM   Mammogram/Pap Hx   Mammogram Date 12/11/2021 12/08/2020   Mammogram Result benign wnl   Pap Date 01/19/2020 01/19/2020   Pap Result neg wnl         02/05/2023     8:00 AM 02/02/2022     8:00 AM   GYN Intake Questionnaire   Current Form of Contraception  Hysterectomy   Dyspareunia Superficial Deep   Post-Coital Bleeding None None   Date of Mammogram  12/11/2021   Result of Mammogram  benign   Date of Pap  01/19/2020   Pap result  neg       Contraception: Hysterectomy  Has received Gardisil: NO  Last Colo : Date of last Colonoscopy: 04/07/2020   No cologuard on file  No FIT/FOBT on file   No flexible sigmoidoscopy on file  Last time cholesterol was checked: 1 years ago    OB History:   OB History   Gravida Para Term Preterm AB Living   3 2 2  1     SAB IAB Ectopic Molar Multiple Live Births   1     2      # Outcome Date GA Lbr Len/2nd Weight Sex Type Anes PTL Lv   3 SAB            2 Term            1 Term                  Health Maintenance Due   Topic Date Due    HIV screen  Never done    Hepatitis C screen  Never done    Hepatitis B vaccine (1 of 3 - 19+ 3-dose series) Never done    Lipids  Never done    DTaP/Tdap/Td vaccine (2 - Td or Tdap) 06/26/2020    Pneumococcal 50+ years Vaccine (2 of 2 - PCV) 04/04/2021    Flu vaccine (1) 10/04/2023    COVID-19 Vaccine (1 - 2024-25 season) Never done    Depression Screen  02/05/2024         GYN History            Victoria Shaw  has a past medical history of Allergic rhinitis and Interstitial cystitis. Victoria Shaw    Her surgeries include  has a past surgical history that  includes Dilation and curettage of uterus (2002); salpingectomy (02/03/2020); Breast biopsy; Breast cyst incision and drainage; Laparoscopic hysterectomy (02/03/2020); and Wisdom tooth extraction..    Allergies   Allergen Reactions    Hydrocodone-Acetaminophen Other (See Comments)     Hallucinations         Her current meds are:   Current Outpatient Medications   Medication Sig    cyclobenzaprine (FLEXERIL) 5 MG tablet Take 1 tablet by mouth 3 times daily as needed for Muscle spasms    levocetirizine (XYZAL) 5 MG tablet Take 1 tablet by mouth nightly    Fluticasone Propionate (FLONASE NA) by  Nasal route    VITAMIN D PO Take by mouth    Ascorbic Acid (VITAMIN C PO) Take by mouth    hydrOXYzine  HCl (ATARAX ) 25 MG tablet Take 1 tablet by mouth every 8 hours as needed (IC symptoms) (Patient not taking: Reported on 02/07/2024)    amitriptyline  (ELAVIL ) 10 MG tablet Take 1 tablet by mouth nightly (Patient not taking: Reported on 02/07/2024)    Cetirizine HCl (ZYRTEC ALLERGY) 10 MG CAPS Take by mouth (Patient not taking: Reported on 02/07/2024)     No current facility-administered medications for this visit.          Family history is significant for family history includes Heart Disease in her father.Victoria Shaw Aquas  reports that she has quit smoking. She has never used smokeless tobacco. She reports that she does not drink alcohol and does not use drugs.     She completed her Systems Review which is documented below.  Any positive systems not related to Gyn are recommended to discuss with her PCP.    Review of Systems   Constitutional: Negative.  Negative for unexpected weight change.   HENT: Negative.     Eyes: Negative.    Respiratory: Negative.  Negative for cough and shortness of breath.    Cardiovascular: Negative.  Negative for chest pain and palpitations.   Gastrointestinal: Negative.  Negative for abdominal pain and blood in stool.   Endocrine: Negative.    Genitourinary:  Positive for frequency (h/o IC, drinks 2-3 cups  coffee today). Negative for difficulty urinating, dysuria, menstrual problem, pelvic pain and urgency.   Musculoskeletal: Negative.    Skin: Negative.    Allergic/Immunologic: Negative.    Neurological: Negative.    Hematological: Negative.    Psychiatric/Behavioral: Negative.  Negative for behavioral problems and confusion.    All other systems reviewed and are negative.      No results found for this visit on 02/07/24.   Blood pressure 125/70, height 1.575 m (5' 2), weight 57.3 kg (126 lb 6.4 oz).   Body mass index is 23.12 kg/m.   Wt Readings from Last 3 Encounters:   02/07/24 57.3 kg (126 lb 6.4 oz)   12/16/23 56.7 kg (125 lb)   02/05/23 56.7 kg (125 lb)      Physical Exam  Vitals reviewed. Exam conducted with a chaperone present.   Constitutional:       General: She is not in acute distress.     Appearance: Normal appearance.   HENT:      Head: Normocephalic and atraumatic.   Eyes:      Extraocular Movements: Extraocular movements intact.      Pupils: Pupils are equal, round, and reactive to light.   Chest:   Breasts:     Breasts are symmetrical.      Right: Normal. No bleeding, inverted nipple, mass, nipple discharge or skin change.      Left: Normal. No bleeding, inverted nipple, mass, nipple discharge or skin change.   Abdominal:      General: Abdomen is flat. There is no distension.      Palpations: Abdomen is soft. There is no mass.      Tenderness: There is no abdominal tenderness. There is no guarding or rebound.      Hernia: No hernia is present. There is no hernia in the left inguinal area or right inguinal area.   Genitourinary:     General: Normal vulva.      Exam position:  Lithotomy position.      Labia:         Right: No rash, tenderness or lesion.         Left: No rash, tenderness or lesion.       Urethra: No urethral pain or urethral lesion.      Vagina: Normal.      Uterus: Absent.       Adnexa: Right adnexa normal and left adnexa normal.        Right: No tenderness.          Left: No  tenderness.        Rectum: Normal anal tone.      Comments: Uterus and cervix surgically absent  Musculoskeletal:         General: Normal range of motion.      Cervical back: Normal range of motion and neck supple.   Lymphadenopathy:      Upper Body:      Right upper body: No axillary adenopathy.      Left upper body: No axillary adenopathy.   Skin:     General: Skin is warm and dry.   Neurological:      General: No focal deficit present.      Mental Status: She is alert and oriented to person, place, and time.   Psychiatric:         Mood and Affect: Mood normal.         Behavior: Behavior normal.         Thought Content: Thought content normal.         Judgment: Judgment normal.          Assessment/plan: Alleah was seen today for Annual Exam       Aubery was seen today for annual exam.    Diagnoses and all orders for this visit:    Well woman exam with routine gynecological exam    Breast cancer screening by mammogram  -     MAM TOMO DIGITAL SCREEN BILATERAL; Future    Urinary frequency     Estrace  not refilled, might decide to try the rx she filled last year and let me know if she wants to continue.  Reassured re safety of vERT.    Return in about 1 year (around 02/06/2025) for Annual.    "
